# Patient Record
Sex: Female | Born: 1995 | Race: Black or African American | Hispanic: No | Marital: Single
Health system: Southern US, Community
[De-identification: ages and names within clinical notes are randomized; demographics above are authoritative.]

---
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
## 2018-10-28 DIAGNOSIS — O30039 Twin pregnancy, monochorionic/diamniotic, unspecified trimester: Secondary | ICD-10-CM | POA: Insufficient documentation

## 2018-12-27 DIAGNOSIS — O09892 Supervision of other high risk pregnancies, second trimester: Secondary | ICD-10-CM | POA: Insufficient documentation

## 2019-01-11 DIAGNOSIS — O30039 Twin pregnancy, monochorionic/diamniotic, unspecified trimester: Secondary | ICD-10-CM | POA: Insufficient documentation

## 2019-01-11 DIAGNOSIS — O365990 Maternal care for other known or suspected poor fetal growth, unspecified trimester, not applicable or unspecified: Secondary | ICD-10-CM | POA: Insufficient documentation

## 2019-03-14 DIAGNOSIS — O43023 Fetus-to-fetus placental transfusion syndrome, third trimester: Secondary | ICD-10-CM | POA: Insufficient documentation

## 2019-03-19 DIAGNOSIS — O99019 Anemia complicating pregnancy, unspecified trimester: Secondary | ICD-10-CM | POA: Insufficient documentation

## 2019-03-22 DIAGNOSIS — O43029 Fetus-to-fetus placental transfusion syndrome, unspecified trimester: Secondary | ICD-10-CM

## 2019-03-22 DIAGNOSIS — O329XX0 Maternal care for malpresentation of fetus, unspecified, not applicable or unspecified: Secondary | ICD-10-CM

## 2019-07-20 ENCOUNTER — Ambulatory Visit: Payer: Medicaid Other | Admitting: Podiatry

## 2019-08-24 ENCOUNTER — Encounter (HOSPITAL_COMMUNITY): Payer: Self-pay

## 2019-08-24 ENCOUNTER — Ambulatory Visit (HOSPITAL_COMMUNITY)
Admission: EM | Admit: 2019-08-24 | Discharge: 2019-08-24 | Disposition: A | Discharge status: Home / Self Care | Payer: Medicaid Other | Attending: Family Medicine | Admitting: Family Medicine

## 2019-08-24 ENCOUNTER — Other Ambulatory Visit: Payer: Self-pay

## 2019-08-24 ENCOUNTER — Ambulatory Visit (INDEPENDENT_AMBULATORY_CARE_PROVIDER_SITE_OTHER): Payer: Medicaid Other

## 2019-08-24 DIAGNOSIS — M533 Sacrococcygeal disorders, not elsewhere classified: Secondary | ICD-10-CM | POA: Diagnosis not present

## 2019-08-24 NOTE — ED Provider Notes (Signed)
[facility]    CSN: [id number] Arrival date & time: 08/24/19  1815      History   Chief Complaint Chief Complaint  Patient presents with  . tailbone pain    HPI Ana Paul is a 24 y.o. female.   She is presenting with tailbone pain.  This is acute on chronic in nature.  Seems to be localized to this area.  She has been dealing this for years.  She recently had a C-section and was carrying twins.  The pain seems gotten worse recently.  No radicular symptoms.  No history of surgery.  Unsure if she ever has had trauma to the area.  HPI  History reviewed. No pertinent past medical history.  There are no problems to display for this patient.   Past Surgical History:  Procedure Laterality Date  . CESAREAN SECTION      OB History   No obstetric history on file.      Home Medications    Prior to Admission medications   Not on File    Family History Family History  Problem Relation Age of Onset  . Healthy Mother   . Healthy Father     Social History Social History   Tobacco Use  . Smoking status: Never Smoker  . Smokeless tobacco: Never Used  Substance Use Topics  . Alcohol use: Never  . Drug use: Never     Allergies   Patient has no known allergies.   Review of Systems Review of Systems See HPI  Physical Exam Triage Vital Signs ED Triage Vitals [08/24/19 1832]  Enc Vitals Group     BP 118/79     Pulse Rate 81     Resp 16     Temp 98.6 F (37 C)     Temp Source Oral     SpO2 100 %     Weight 210 lb (95.3 kg)     Height 5\' 5"  (1.651 m)     Head Circumference      Peak Flow      Pain Score 8     Pain Loc      Pain Edu?      Excl. in [place]?    No data found.  Updated Vital Signs BP 118/79   Pulse 81   Temp 98.6 F (37 C) (Oral)   Resp 16   Ht 5\' 5"  (1.651 m)   Wt 95.3 kg   SpO2 100%   BMI 34.95 kg/m   Visual Acuity Right Eye Distance:   Left Eye Distance:   Bilateral Distance:    Right Eye Near:   Left Eye  Near:    Bilateral Near:     Physical Exam Gen: NAD, alert, cooperative with exam, well-appearing Neuro: normal tone, normal sensation to touch Psych:  normal insight, alert and oriented MSK:  TTP at the tip of the coccyx. Normal flexion extension of the back. Normal internal and external rotation of the hips. No tenderness to palpation of the SI joints. Neurovascularly intact   UC Treatments / Results  Labs (all labs ordered are listed, but only abnormal results are displayed) Labs Reviewed - No data to display  EKG   Radiology DG Sacrum/Coccyx  Result Date: 08/24/2019 CLINICAL DATA:  Coccyx pain. Injured tailbone 15 years ago now with worsening pain EXAM: SACRUM AND COCCYX - 2+ VIEW COMPARISON:  None. FINDINGS: There is no evidence of fracture or other focal bone lesions. IMPRESSION: Negative. Electronically Signed  By: Kerby Moors M.D.   On: 08/24/2019 19:09    Procedures Procedures (including critical care time)  Medications Ordered in UC Medications - No data to display  Initial Impression / Assessment and Plan / UC Course  I have reviewed the triage vital signs and the nursing notes.  Pertinent labs & imaging results that were available during my care of the patient were reviewed by me and considered in my medical decision making (see chart for details).     Ana Paul is a 24 year old female is presenting with symptoms suggestive coccydynia.  She recently gave birth to twins but did have a C-section.  Symptoms been ongoing.  Imaging was negative.  Counseled on supportive care given indications to return and follow-up.  Final Clinical Impressions(s) / UC Diagnoses   Final diagnoses:  Coccydynia     Discharge Instructions     Please try ibuprofen  Please try to alternate heat and ice  Please follow up if your symptoms fail to improve.     ED Prescriptions    None     PDMP not reviewed this encounter.   Rosemarie Ax, MD 08/24/19 [phone]

## 2019-08-24 NOTE — ED Triage Notes (Signed)
Pt states she has tailbone pain that has been gong on for years, but it got extremely worse upon waking today. Pt c/o 7/10 pain in tailbone. Pt states she hit her tailbone when she was 75 or 25 y/o and it had been hurting on and off since then, but lately it's gotten worse

## 2019-08-24 NOTE — Discharge Instructions (Addendum)
Please try ibuprofen  Please try to alternate heat and ice  Please follow up if your symptoms fail to improve.

## 2019-08-31 ENCOUNTER — Ambulatory Visit: Payer: Medicaid Other | Attending: Internal Medicine

## 2019-08-31 DIAGNOSIS — Z20822 Contact with and (suspected) exposure to covid-19: Secondary | ICD-10-CM

## 2019-09-01 LAB — SARS-COV-2, NAA 2 DAY TAT

## 2019-09-01 LAB — NOVEL CORONAVIRUS, NAA: SARS-CoV-2, NAA: NOT DETECTED

## 2019-11-15 ENCOUNTER — Ambulatory Visit: Payer: Medicaid Other | Admitting: Podiatry

## 2019-11-15 ENCOUNTER — Other Ambulatory Visit: Payer: Self-pay

## 2019-11-15 DIAGNOSIS — L600 Ingrowing nail: Secondary | ICD-10-CM | POA: Diagnosis not present

## 2019-11-15 DIAGNOSIS — M79675 Pain in left toe(s): Secondary | ICD-10-CM

## 2019-11-15 NOTE — Patient Instructions (Signed)

## 2019-11-16 NOTE — Progress Notes (Signed)
Subjective:   Patient ID: Ana Paul, female   DOB: 24 y.o.   MRN: [id number]   HPI 24 year old female presents the office today for concerns of a painful ingrown toenail left big toe, medial aspect which is been ongoing since February has been intermittent.  Denies any drainage or pus coming from the area.  She does note some swelling to the nail border.  Denies any red streaks.  She has no other concerns today.   Review of Systems  All other systems reviewed and are negative.  No past medical history on file.  Past Surgical History:  Procedure Laterality Date   CESAREAN SECTION      No current outpatient medications on file.  No Known Allergies       Objective:  Physical Exam  General: AAO x3, NAD  Dermatological: Incurvation present to the medial aspect of the left hallux toenail with tenderness palpation there is localized edema there is no erythema or ascending cellulitis.  No drainage or pus identified today.  There is no open lesions.  Vascular: Dorsalis Pedis artery and Posterior Tibial artery pedal pulses are 2/4 bilateral with immedate capillary fill time. There is no pain with calf compression, swelling, warmth, erythema.   Neruologic: Grossly intact via light touch bilateral.   Musculoskeletal: No gross boney pedal deformities bilateral. No pain, crepitus, or limitation noted with foot and ankle range of motion bilateral. Muscular strength 5/5 in all groups tested bilateral.  Gait: Unassisted, Nonantalgic.       Assessment:   Left medial hallux ingrown toenail    Plan:  -Treatment options discussed including all alternatives, risks, and complications -Etiology of symptoms were discussed -At this time, the patient is requesting partial nail removal with chemical matricectomy to the symptomatic portion of the nail. Risks and complications were discussed with the patient for which they understand and written consent was obtained. Under sterile conditions a  total of 3 mL of a mixture of 2% lidocaine plain and 0.5% Marcaine plain was infiltrated in a hallux block fashion. Once anesthetized, the skin was prepped in sterile fashion. A tourniquet was then applied. Next the medial aspect of hallux nail border was then sharply excised making sure to remove the entire offending nail border. Once the nails were ensured to be removed area was debrided and the underlying skin was intact. There is no purulence identified in the procedure. Next phenol was then applied under standard conditions and copiously irrigated. Silvadene was applied. A dry sterile dressing was applied. After application of the dressing the tourniquet was removed and there is found to be an immediate capillary refill time to the digit. The patient tolerated the procedure well any complications. Post procedure instructions were discussed the patient for which he verbally understood. Follow-up in one week for nail check or sooner if any problems are to arise. Discussed signs/symptoms of infection and directed to call the office immediately should any occur or go directly to the emergency room. In the meantime, encouraged to call the office with any questions, concerns, changes symptoms.  Return for nail check-left big toe .  Trula Slade DPM

## 2019-11-24 ENCOUNTER — Encounter: Payer: Self-pay | Admitting: Podiatry

## 2019-11-24 ENCOUNTER — Ambulatory Visit (INDEPENDENT_AMBULATORY_CARE_PROVIDER_SITE_OTHER): Payer: Medicaid Other | Admitting: Podiatry

## 2019-11-24 ENCOUNTER — Other Ambulatory Visit: Payer: Self-pay

## 2019-11-24 DIAGNOSIS — L600 Ingrowing nail: Secondary | ICD-10-CM

## 2019-11-24 NOTE — Progress Notes (Signed)
  Subjective:  Patient ID: Letetia Romanello, female    DOB: 01-04-1996,  MRN: [id number]  Chief Complaint  Patient presents with  . Ingrown Toenail    2WK F/U- pt states she is doign well,     24 y.o. female presents with the above complaint. History confirmed with patient. Still doing soaks and neosporin/bandaid  Objective:  Physical Exam: warm, good capillary refill, no trophic changes or ulcerative lesions, normal DP and PT pulses and normal sensory exam. Left Foot: hallux nail site healing well, mild fibrotic drainage, no SOI Assessment:   1. Ingrown toenail      Plan:  Patient was evaluated and treated and all questions answered.  OK to soak once daily for next week, leave OTA at night, keep covered with clean dry bandaid while in closed shoes / out of house.  Return in about 6 weeks (around 01/05/2020) for nail check with [facility].   Lanae Crumbly, DPM 11/24/2019

## 2020-01-05 ENCOUNTER — Ambulatory Visit: Payer: Medicaid Other | Admitting: Podiatry

## 2020-01-10 ENCOUNTER — Other Ambulatory Visit: Payer: Self-pay

## 2020-01-10 ENCOUNTER — Other Ambulatory Visit: Payer: Medicaid Other

## 2020-01-10 ENCOUNTER — Other Ambulatory Visit: Payer: Self-pay | Admitting: *Deleted

## 2020-01-10 DIAGNOSIS — Z20822 Contact with and (suspected) exposure to covid-19: Secondary | ICD-10-CM

## 2020-01-11 LAB — NOVEL CORONAVIRUS, NAA

## 2020-03-27 ENCOUNTER — Emergency Department (HOSPITAL_COMMUNITY)
Admission: EM | Admit: 2020-03-27 | Discharge: 2020-03-28 | Disposition: A | Discharge status: Home / Self Care | Payer: Medicaid Other | Attending: Emergency Medicine | Admitting: Emergency Medicine

## 2020-03-27 ENCOUNTER — Encounter (HOSPITAL_COMMUNITY): Payer: Self-pay | Admitting: Emergency Medicine

## 2020-03-27 ENCOUNTER — Other Ambulatory Visit: Payer: Self-pay

## 2020-03-27 DIAGNOSIS — D509 Iron deficiency anemia, unspecified: Secondary | ICD-10-CM | POA: Diagnosis not present

## 2020-03-27 DIAGNOSIS — K429 Umbilical hernia without obstruction or gangrene: Secondary | ICD-10-CM | POA: Diagnosis not present

## 2020-03-27 DIAGNOSIS — R1033 Periumbilical pain: Secondary | ICD-10-CM | POA: Diagnosis present

## 2020-03-27 NOTE — ED Triage Notes (Signed)
Pt st's she has a umbilical hernia and is having pain at the site

## 2020-03-28 ENCOUNTER — Other Ambulatory Visit: Payer: Self-pay

## 2020-03-28 LAB — COMPREHENSIVE METABOLIC PANEL
ALT: 19 U/L (ref 0–44)
AST: 20 U/L (ref 15–41)
Albumin: 3.8 g/dL (ref 3.5–5.0)
Alkaline Phosphatase: 67 U/L (ref 38–126)
Anion gap: 8 (ref 5–15)
BUN: 9 mg/dL (ref 6–20)
CO2: 25 mmol/L (ref 22–32)
Calcium: 9.2 mg/dL (ref 8.9–10.3)
Chloride: 105 mmol/L (ref 98–111)
Creatinine, Ser: 0.75 mg/dL (ref 0.44–1.00)
GFR, Estimated: >60 mL/min (ref >60)
Glucose, Bld: 101 mg/dL — ABNORMAL HIGH (ref 70–99)
Potassium: 3.7 mmol/L (ref 3.5–5.1)
Sodium: 138 mmol/L (ref 135–145)
Total Bilirubin: 0.8 mg/dL (ref 0.3–1.2)
Total Protein: 7 g/dL (ref 6.5–8.1)

## 2020-03-28 LAB — LACTIC ACID, PLASMA: Lactic Acid, Venous: 1.1 mmol/L (ref 0.5–1.9)

## 2020-03-28 LAB — CBC WITH DIFFERENTIAL/PLATELET
Abs Immature Granulocytes: 0.01 10*3/uL (ref 0.00–0.07)
Basophils Absolute: 0 10*3/uL (ref 0.0–0.1)
Basophils Relative: 1 %
Eosinophils Absolute: 0.1 10*3/uL (ref 0.0–0.5)
Eosinophils Relative: 2 %
HCT: 39.1 % (ref 36.0–46.0)
Hemoglobin: 11.7 g/dL — ABNORMAL LOW (ref 12.0–15.0)
Immature Granulocytes: 0 %
Lymphocytes Relative: 33 %
Lymphs Abs: 1.7 10*3/uL (ref 0.7–4.0)
MCH: 22.7 pg — ABNORMAL LOW (ref 26.0–34.0)
MCHC: 29.9 g/dL — ABNORMAL LOW (ref 30.0–36.0)
MCV: 75.8 fL — ABNORMAL LOW (ref 80.0–100.0)
Monocytes Absolute: 0.5 10*3/uL (ref 0.1–1.0)
Monocytes Relative: 10 %
Neutro Abs: 2.8 10*3/uL (ref 1.7–7.7)
Neutrophils Relative %: 54 %
Platelets: 280 10*3/uL (ref 150–400)
RBC: 5.16 MIL/uL — ABNORMAL HIGH (ref 3.87–5.11)
RDW: 14.9 % (ref 11.5–15.5)
WBC: 5.2 10*3/uL (ref 4.0–10.5)
nRBC: 0 % (ref 0.0–0.2)

## 2020-03-28 LAB — LIPASE, BLOOD: Lipase: 32 U/L (ref 11–51)

## 2020-03-28 MED ORDER — KETOROLAC TROMETHAMINE 30 MG/ML IJ SOLN
30.0000 mg | Freq: Once | INTRAMUSCULAR | Status: AC
  Administered 2020-03-28: 30 mg via INTRAVENOUS
  Filled 2020-03-28: qty 1

## 2020-03-28 NOTE — ED Provider Notes (Signed)
[facility] EMERGENCY DEPARTMENT Provider Note   CSN: [id number] Arrival date & time: 03/27/20  2253   History Chief Complaint  Patient presents with  . Abdominal Pain    Ana Paul is a 24 y.o. female.  The history is provided by the patient.  Abdominal Pain She has history of an umbilical hernia and comes in complaining of pain where the hernia is located. Pain started in the afternoon and has been steady. She rates pain at 7/10. There is no radiation of pain. There is no nausea or vomiting. She has not done anything to treat the pain. Nothing makes it better, nothing makes it worse.  History reviewed. No pertinent past medical history.  There are no problems to display for this patient.   Past Surgical History:  Procedure Laterality Date  . CESAREAN SECTION       OB History   No obstetric history on file.     Family History  Problem Relation Age of Onset  . Healthy Mother   . Healthy Father     Social History   Tobacco Use  . Smoking status: Never Smoker  . Smokeless tobacco: Never Used  Vaping Use  . Vaping Use: Never used  Substance Use Topics  . Alcohol use: Never  . Drug use: Never    Home Medications Prior to Admission medications   Not on File    Allergies    Patient has no known allergies.  Review of Systems   Review of Systems  Gastrointestinal: Positive for abdominal pain.  All other systems reviewed and are negative.   Physical Exam Updated Vital Signs BP 135/84 (BP Location: Left Arm)   Pulse 71   Temp 98.2 F (36.8 C) (Oral)   Resp 16   Ht 5\' 4"  (1.626 m)   Wt 97.5 kg   LMP 03/25/2020 (Exact Date)   SpO2 100%   BMI 36.90 kg/m   Physical Exam Vitals and nursing note reviewed.   24 year old female, resting comfortably and in no acute distress. Vital signs are normal3. Oxygen saturation is 100%, which is normal. Head is normocephalic and atraumatic. PERRLA, EOMI. Oropharynx is clear. Neck is  nontender and supple without adenopathy or JVD. Back is nontender and there is no CVA tenderness. Lungs are clear without rales, wheezes, or rhonchi. Chest is nontender. Heart has regular rate and rhythm without murmur. Abdomen is soft, flat, with a combined periumbilical and ventral hernia present. The ventral hernia is superior to the umbilicus. Hernia is easily reducible, but mildly tender. There are no masses or hepatosplenomegaly and peristalsis is normoactive. Extremities have no cyanosis or edema, full range of motion is present. Skin is warm and dry without rash. Neurologic: Mental status is normal, cranial nerves are intact, there are no motor or sensory deficits.  ED Results / Procedures / Treatments   Labs (all labs ordered are listed, but only abnormal results are displayed) Labs Reviewed  COMPREHENSIVE METABOLIC PANEL - Abnormal; Notable for the following components:      Result Value   Glucose, Bld 101 (*)    All other components within normal limits  CBC WITH DIFFERENTIAL/PLATELET - Abnormal; Notable for the following components:   RBC 5.16 (*)    Hemoglobin 11.7 (*)    MCV 75.8 (*)    MCH 22.7 (*)    MCHC 29.9 (*)    All other components within normal limits  LIPASE, BLOOD  LACTIC ACID, PLASMA   Procedures Procedures  Medications Ordered in ED Medications  ketorolac (TORADOL) 30 MG/ML injection 30 mg (has no administration in time range)    ED Course  I have reviewed the triage vital signs and the nursing notes.  Pertinent labs & imaging results that were available during my care of the patient were reviewed by me and considered in my medical decision making (see chart for details).  MDM Rules/Calculators/A&P Abdominal pain secondary to umbilical hernia. No evidence of incarceration. Old records are reviewed, no prior abdominal imaging. However, with benign abdominal exam, no indication for CT scan. Will check screening labs. Anticipate referral to general  surgery.  Labs are unremarkable.  She has very mild anemia.  There was significant pain relief with ketorolac.  Patient is advised to use over-the-counter analgesics as needed for pain and is referred to [facility] surgery for consultation regarding possible surgical repair of her hernia.  Final Clinical Impression(s) / ED Diagnoses Final diagnoses:  Umbilical hernia without obstruction and without gangrene  Microcytic anemia    Rx / DC Orders ED Discharge Orders    None       Delora Fuel, MD 81/10/31 [phone]

## 2020-03-28 NOTE — Discharge Instructions (Addendum)
He may take acetaminophen, ibuprofen, or naproxen as needed for pain.  If you need additional pain relief, you may add acetaminophen to either ibuprofen or naproxen.  Return if you her having severe pain or vomiting.

## 2020-04-24 ENCOUNTER — Ambulatory Visit: Payer: Self-pay | Admitting: General Surgery

## 2020-06-21 ENCOUNTER — Ambulatory Visit: Payer: Medicaid Other | Admitting: Family Medicine

## 2020-06-29 ENCOUNTER — Ambulatory Visit: Payer: Medicaid Other | Admitting: Family

## 2020-06-29 ENCOUNTER — Other Ambulatory Visit: Payer: Self-pay

## 2020-06-29 NOTE — Progress Notes (Deleted)
  Subjective:    Ana Paul - 25 y.o. female MRN [id number]  Date of birth: Jun 06, 1995  HPI  Ana Paul is to establish care. Patient has a PMH significant for ***.    Current issues and/or concerns: 1. BREAST PAIN: Duration :{Blank single:19197::"days","weeks","months"} Location: {Blank single:19197::"right","left","bilateral"} Onset: {Blank single:19197::"sudden","gradual"} Severity: {Blank single:19197::"mild","moderate","severe","1/10","2/10","3/10","4/10","5/10","6/10","7/10","8/10","9/10","10/10"} Quality: {Blank multiple:19196::"sharp","dull","aching","burning","cramping","ill-defined","itchy","pressure-like","pulling","shooting","sore","stabbing","tender","tearing","throbbing"} Frequency: {Blank single:19197::"constant","intermittent","occasional","rare","every few minutes","a few times a hour","a few times a day","a few times a week","a few times a month","a few times a year"} Redness: {Blank single:19197::"yes","no"} Swelling: {Blank single:19197::"yes","no"} Trauma: {Blank single:19197::"trauma","no trauma"} Breastfeeding: {Blank single:19197::"yes","no"} Associated with menstral cycle: {Blank single:19197::"yes","no"} Nipple discharge: {Blank single:19197::"yes","no"} Breast lump: {Blank single:19197::"yes","no"} Status: {Blank multiple:19196::"better","worse","stable","fluctuating"} Treatments attempted: {Blank multiple:19196::"none","primrose oil","ibuprofen","tylenol"} Previous mammogram: {Blank single:19197::"yes","no"}  ROS per HPI     Health Maintenance:  - *** Health Maintenance Due  Topic Date Due  . Hepatitis C Screening  Never done  . COVID-19 Vaccine (1) Never done  . HIV Screening  Never done  . PAP-Cervical Cytology Screening  Never done  . PAP SMEAR-Modifier  Never done  . INFLUENZA VACCINE  12/18/2019     Past Medical History: There are no problems to display for this patient.     Social History   reports that she has never smoked.  She has never used smokeless tobacco. She reports that she does not drink alcohol and does not use drugs.   Family History  family history includes Healthy in her father and mother.   Medications: reviewed and updated   Objective:   Physical Exam There were no vitals taken for this visit. Physical Exam      Assessment & Plan:       Durene Fruits, NP 06/29/2020, 7:50 AM Primary Care at [facility]

## 2020-07-17 ENCOUNTER — Other Ambulatory Visit (HOSPITAL_COMMUNITY): Payer: Medicaid Other

## 2020-07-19 ENCOUNTER — Ambulatory Visit: Admit: 2020-07-19 | Payer: Medicaid Other | Admitting: General Surgery

## 2020-07-19 SURGERY — REPAIR, HERNIA, VENTRAL, LAPAROSCOPIC
Anesthesia: General

## 2020-12-08 ENCOUNTER — Emergency Department (HOSPITAL_COMMUNITY)
Admission: EM | Admit: 2020-12-08 | Discharge: 2020-12-08 | Disposition: A | Discharge status: Home / Self Care | Payer: Medicaid Other | Attending: Emergency Medicine | Admitting: Emergency Medicine

## 2020-12-08 ENCOUNTER — Other Ambulatory Visit: Payer: Self-pay

## 2020-12-08 DIAGNOSIS — H6122 Impacted cerumen, left ear: Secondary | ICD-10-CM

## 2020-12-08 DIAGNOSIS — H9202 Otalgia, left ear: Secondary | ICD-10-CM | POA: Diagnosis present

## 2020-12-08 NOTE — ED Provider Notes (Signed)
[facility] DEPT Provider Note   CSN: [id number] Arrival date & time: 12/08/20  2002     History Chief Complaint  Patient presents with   Otalgia    Ana Paul is a 25 y.o. female.  25 year old female with complaint of left ear discomfort, feels like her ear has been clogged for a few days, now painful.  Denies recent swimming.  States that she does have mild runny nose and cough.  No other complaints or concerns.      No past medical history on file.  There are no problems to display for this patient.   OB History   No obstetric history on file.     No family history on file.     Home Medications Prior to Admission medications   Not on File    Allergies    Patient has no known allergies.  Review of Systems   Review of Systems  Constitutional:  Negative for fever.  HENT:  Positive for congestion and ear pain.   Respiratory:  Negative for cough.   Musculoskeletal:  Negative for arthralgias and myalgias.  Skin:  Negative for rash and wound.  Allergic/Immunologic: Negative for immunocompromised state.  Neurological:  Negative for weakness and headaches.  Hematological:  Negative for adenopathy.   Physical Exam Updated Vital Signs BP (!) 141/85 (BP Location: Right Arm)   Pulse 85   Temp 98.4 F (36.9 C) (Oral)   Resp 18   Ht '5\' 4"'$  (1.626 m)   Wt 100.2 kg   SpO2 100%   BMI 37.93 kg/m   Physical Exam Vitals and nursing note reviewed.  Constitutional:      General: She is not in acute distress.    Appearance: She is well-developed. She is not diaphoretic.  HENT:     Head: Normocephalic and atraumatic.     Right Ear: Tympanic membrane and ear canal normal.     Left Ear: There is impacted cerumen.     Nose: Nose normal.     Mouth/Throat:     Mouth: Mucous membranes are moist.  Eyes:     Conjunctiva/sclera: Conjunctivae normal.  Pulmonary:     Effort: Pulmonary effort is normal.  Musculoskeletal:     Cervical  back: Neck supple.  Lymphadenopathy:     Cervical: No cervical adenopathy.  Skin:    General: Skin is warm and dry.     Findings: No erythema or rash.  Neurological:     Mental Status: She is alert and oriented to person, place, and time.  Psychiatric:        Behavior: Behavior normal.    ED Results / Procedures / Treatments   Labs (all labs ordered are listed, but only abnormal results are displayed) Labs Reviewed - No data to display  EKG None  Radiology No results found.  Procedures .Ear Cerumen Removal  Date/Time: 12/08/2020 8:57 PM Performed by: Tacy Learn, PA-C Authorized by: Tacy Learn, PA-C   Consent:    Consent obtained:  Verbal   Consent given by:  Patient   Risks discussed:  Bleeding, infection, incomplete removal, pain and TM perforation   Alternatives discussed:  No treatment Universal protocol:    Patient identity confirmed:  Verbally with patient Procedure details:    Location:  L ear   Procedure type: curette     Procedure outcomes: cerumen removed   Post-procedure details:    Inspection:  Ear canal clear and TM intact   Hearing quality:  Improved   Procedure completion:  Tolerated well, no immediate complications   Medications Ordered in ED Medications - No data to display  ED Course  I have reviewed the triage vital signs and the nursing notes.  Pertinent labs & imaging results that were available during my care of the patient were reviewed by me and considered in my medical decision making (see chart for details).  Clinical Course as of 12/08/20 2057  Sat Dec 09, 3750  5939 25 year old female with left ear pain found to have impacted cerumen which was removed with curette without difficulty.  TM intact after removal. [LM]    Clinical Course User Index [LM] Roque Lias   MDM Rules/Calculators/A&P                           Final Clinical Impression(s) / ED Diagnoses Final diagnoses:  Impacted cerumen of left ear     Rx / DC Orders ED Discharge Orders     None        Roque Lias 12/08/20 2057    Dorie Rank, MD 12/10/20 1012

## 2020-12-08 NOTE — Discharge Instructions (Addendum)
Follow-up with your primary care provider if pain persists.  Referral to PCP if needed.

## 2020-12-08 NOTE — ED Triage Notes (Signed)
Pt came in with c/o L ear pain that started two hours ago. Pt states that she has a runny nose and cough as well.

## 2020-12-13 ENCOUNTER — Ambulatory Visit
Admission: RE | Admit: 2020-12-13 | Discharge: 2020-12-13 | Disposition: A | Discharge status: Home / Self Care | Payer: Medicaid Other | Source: Ambulatory Visit | Attending: Urgent Care | Admitting: Urgent Care

## 2020-12-13 ENCOUNTER — Other Ambulatory Visit: Payer: Self-pay

## 2020-12-13 VITALS — BP 118/79 | HR 66 | Temp 98.0°F | Resp 18

## 2020-12-13 DIAGNOSIS — R070 Pain in throat: Secondary | ICD-10-CM

## 2020-12-13 DIAGNOSIS — J069 Acute upper respiratory infection, unspecified: Secondary | ICD-10-CM | POA: Diagnosis present

## 2020-12-13 LAB — POCT RAPID STREP A (OFFICE): Rapid Strep A Screen: NEGATIVE

## 2020-12-13 MED ORDER — BENZONATATE 100 MG PO CAPS: 100.0000 mg | ORAL_CAPSULE | Freq: Three times a day (TID) | ORAL | 0 refills | Status: DC | PRN

## 2020-12-13 MED ORDER — PROMETHAZINE-DM 6.25-15 MG/5ML PO SYRP: 5.0000 mL | ORAL_SOLUTION | Freq: Every evening | ORAL | 0 refills | Status: DC | PRN

## 2020-12-13 MED ORDER — CETIRIZINE HCL 10 MG PO TABS: 10.0000 mg | ORAL_TABLET | Freq: Every day | ORAL | 0 refills | Status: DC

## 2020-12-13 MED ORDER — PSEUDOEPHEDRINE HCL 60 MG PO TABS: 60.0000 mg | ORAL_TABLET | Freq: Three times a day (TID) | ORAL | 0 refills | Status: DC | PRN

## 2020-12-13 NOTE — Discharge Instructions (Addendum)

## 2020-12-13 NOTE — ED Triage Notes (Signed)
5 day sore throat, decreased hearing from her right ear and onset this morning up left eye discharge. Pt confirms dysphagia and notes upon waking her eye was crusted shut. Has been taking tylenol with some relief. No abdominal pain, n/v/d/r.

## 2020-12-13 NOTE — ED Provider Notes (Signed)
[place]   MRN: [id number] DOB: [birth year]  Subjective:   Ana Paul is a 25 y.o. female presenting for 5-day history of acute onset throat pain, painful swallowing, sinus congestion and sinus pressure, right ear pain and left eye discharge.  Patient has been using Tylenol with some relief.  Has also had a little bit of a cough.  Denies chest pain, shortness of breath, wheezing.  No history of asthma.  Patient on a smoker.  She is COVID vaccinated but no booster.  No current facility-administered medications for this encounter. No current outpatient medications on file.   No Known Allergies  History reviewed. No pertinent past medical history.   Past Surgical History:  Procedure Laterality Date   CESAREAN SECTION     CESAREAN SECTION N/A    Phreesia 06/29/2020    Family History  Problem Relation Age of Onset   Healthy Mother    Healthy Father     Social History   Tobacco Use   Smoking status: Never   Smokeless tobacco: Never  Vaping Use   Vaping Use: Never used  Substance Use Topics   Alcohol use: Never   Drug use: Never    ROS   Objective:   Vitals: BP 118/79 (BP Location: Left Arm)   Pulse 66   Temp 98 F (36.7 C) (Oral)   Resp 18   SpO2 98%   Physical Exam Constitutional:      General: She is not in acute distress.    Appearance: Normal appearance. She is well-developed. She is not ill-appearing, toxic-appearing or diaphoretic.  HENT:     Head: Normocephalic and atraumatic.     Right Ear: Ear canal normal. No drainage or tenderness. No middle ear effusion. Tympanic membrane is not erythematous.     Left Ear: Ear canal normal. No drainage or tenderness.  No middle ear effusion. Tympanic membrane is not erythematous.     Ears:     Comments: TMs opacified bilaterally.    Nose: Congestion and rhinorrhea present.     Mouth/Throat:     Mouth: Mucous membranes are moist. No oral lesions.     Pharynx: Oropharynx is clear. No  pharyngeal swelling, oropharyngeal exudate, posterior oropharyngeal erythema or uvula swelling.     Tonsils: No tonsillar exudate or tonsillar abscesses.  Eyes:     Extraocular Movements: Extraocular movements intact.     Right eye: Normal extraocular motion.     Left eye: Normal extraocular motion.     Conjunctiva/sclera: Conjunctivae normal.     Pupils: Pupils are equal, round, and reactive to light.  Cardiovascular:     Rate and Rhythm: Normal rate and regular rhythm.     Pulses: Normal pulses.     Heart sounds: Normal heart sounds. No murmur heard.   No friction rub. No gallop.  Pulmonary:     Effort: Pulmonary effort is normal. No respiratory distress.     Breath sounds: Normal breath sounds. No stridor. No wheezing, rhonchi or rales.  Musculoskeletal:     Cervical back: Normal range of motion and neck supple.  Lymphadenopathy:     Cervical: No cervical adenopathy.  Skin:    General: Skin is warm and dry.     Findings: No rash.  Neurological:     General: No focal deficit present.     Mental Status: She is alert and oriented to person, place, and time.  Psychiatric:        Mood and Affect: Mood normal.  Behavior: Behavior normal.        Thought Content: Thought content normal.    Results for orders placed or performed during the hospital encounter of 12/13/20 (from the past 24 hour(s))  POCT rapid strep A     Status: None   Collection Time: 12/13/20 12:53 PM  Result Value Ref Range   Rapid Strep A Screen Negative Negative    Assessment and Plan :   PDMP not reviewed this encounter.  1. Viral URI with cough   2. Throat pain     Will manage for viral illness such as viral URI, viral syndrome, viral rhinitis, COVID-19. Counseled patient on nature of COVID-19 including modes of transmission, diagnostic testing, management and supportive care.  Offered scripts for symptomatic relief. COVID 19 and strep culture are pending. Counseled patient on potential for  adverse effects with medications prescribed/recommended today, ER and return-to-clinic precautions discussed, patient verbalized understanding.     Jaynee Eagles, PA-C 12/13/20 1258

## 2020-12-14 LAB — SARS-COV-2, NAA 2 DAY TAT

## 2020-12-14 LAB — NOVEL CORONAVIRUS, NAA: SARS-CoV-2, NAA: NOT DETECTED

## 2020-12-16 LAB — CULTURE, GROUP A STREP (THRC)

## 2021-03-24 ENCOUNTER — Other Ambulatory Visit: Payer: Self-pay

## 2021-03-24 ENCOUNTER — Emergency Department (HOSPITAL_COMMUNITY)
Admission: EM | Admit: 2021-03-24 | Discharge: 2021-03-25 | Discharge status: Against Medical Advice | Payer: BLUE CROSS/BLUE SHIELD | Attending: Emergency Medicine | Admitting: Emergency Medicine

## 2021-03-24 ENCOUNTER — Encounter (HOSPITAL_COMMUNITY): Payer: Self-pay

## 2021-03-24 DIAGNOSIS — M79671 Pain in right foot: Secondary | ICD-10-CM | POA: Insufficient documentation

## 2021-03-24 DIAGNOSIS — M79672 Pain in left foot: Secondary | ICD-10-CM | POA: Insufficient documentation

## 2021-03-24 DIAGNOSIS — Z5321 Procedure and treatment not carried out due to patient leaving prior to being seen by health care provider: Secondary | ICD-10-CM | POA: Insufficient documentation

## 2021-03-24 NOTE — ED Triage Notes (Signed)
Pt complains of bilateral foot pain when she walks. Pt states that it is a burning feeling and that she has to slide her feet across the floor.

## 2021-09-22 ENCOUNTER — Emergency Department (HOSPITAL_COMMUNITY): Payer: Medicaid Other

## 2021-09-22 ENCOUNTER — Encounter (HOSPITAL_COMMUNITY): Payer: Self-pay | Admitting: Emergency Medicine

## 2021-09-22 ENCOUNTER — Other Ambulatory Visit: Payer: Self-pay

## 2021-09-22 ENCOUNTER — Emergency Department (HOSPITAL_COMMUNITY)
Admission: EM | Admit: 2021-09-22 | Discharge: 2021-09-22 | Disposition: A | Discharge status: Home / Self Care | Payer: Medicaid Other | Attending: Emergency Medicine | Admitting: Emergency Medicine

## 2021-09-22 DIAGNOSIS — Y9341 Activity, dancing: Secondary | ICD-10-CM | POA: Diagnosis not present

## 2021-09-22 DIAGNOSIS — S8991XA Unspecified injury of right lower leg, initial encounter: Secondary | ICD-10-CM | POA: Diagnosis present

## 2021-09-22 DIAGNOSIS — W1830XA Fall on same level, unspecified, initial encounter: Secondary | ICD-10-CM | POA: Diagnosis not present

## 2021-09-22 DIAGNOSIS — S83004A Unspecified dislocation of right patella, initial encounter: Secondary | ICD-10-CM | POA: Insufficient documentation

## 2021-09-22 MED ORDER — PROPOFOL 10 MG/ML IV BOLUS
INTRAVENOUS | Status: AC | PRN
  Administered 2021-09-22: 50.1 mg via INTRAVENOUS

## 2021-09-22 MED ORDER — HYDROMORPHONE HCL 1 MG/ML IJ SOLN
1.0000 mg | Freq: Once | INTRAMUSCULAR | Status: AC
  Administered 2021-09-22: 1 mg via INTRAVENOUS
  Filled 2021-09-22: qty 1

## 2021-09-22 MED ORDER — ONDANSETRON HCL 4 MG/2ML IJ SOLN
4.0000 mg | Freq: Once | INTRAMUSCULAR | Status: AC
  Administered 2021-09-22: 4 mg via INTRAVENOUS
  Filled 2021-09-22: qty 2

## 2021-09-22 MED ORDER — MIDAZOLAM HCL (PF) 5 MG/ML IJ SOLN
2.0000 mg | Freq: Once | INTRAMUSCULAR | Status: AC
  Administered 2021-09-22: 2 mg via INTRAVENOUS
  Filled 2021-09-22: qty 1

## 2021-09-22 MED ORDER — PROPOFOL 10 MG/ML IV BOLUS
0.5000 mg/kg | Freq: Once | INTRAVENOUS | Status: AC
  Administered 2021-09-22: 50.1 mg via INTRAVENOUS
  Filled 2021-09-22: qty 20

## 2021-09-22 NOTE — Progress Notes (Signed)
Orthopedic Tech Progress Note ?Patient Details:  ?Ana Paul ?[birth year] ?[id number] ? ?Ortho Devices ?Type of Ortho Device: Knee Immobilizer, Crutches ?Ortho Device/Splint Location: RLE ?Ortho Device/Splint Interventions: Ordered, Application, Adjustment ?  ?Post Interventions ?Patient Tolerated: Well ?Instructions Provided: Adjustment of device, Care of device ? ?Vernona Rieger ?09/22/2021, 6:52 PM ? ?

## 2021-09-22 NOTE — Discharge Instructions (Signed)
Wear the knee immobilizer and use the crutches.  Use Tylenol or Motrin as needed for pain.  Follow-up with the orthopedic doctor.  Return to the ED with new or worsening symptoms. ?

## 2021-09-22 NOTE — ED Provider Notes (Signed)
?[place] ?Provider Note ? ? ?CSN: [id number] ?Arrival date & time: 09/22/21  1655 ? ?  ? ?History ? ?Chief Complaint  ?Patient presents with  ? Fall  ? ? ?Ana Paul is a 26 y.o. female. ? ?Patient presents with right leg pain that onset today while she was dancing.  States she did not fall but was leaning backwards and felt something pull into her right leg and knee area.  EMS reports pain and deformity to right knee.  Unable to bear weight or bend the knee.  Did not fall or hit her head.  No head, neck, back, chest or abdominal pain.  No fever. ?Denies any other injury ? ?The history is provided by the patient and the EMS personnel.  ?Fall ?Pertinent negatives include no chest pain, no abdominal pain, no headaches and no shortness of breath.  ? ?  ? ?Home Medications ?Prior to Admission medications   ?Medication Sig Start Date End Date Taking? Authorizing Provider  ?benzonatate (TESSALON) 100 MG capsule Take 1-2 capsules (100-200 mg total) by mouth 3 (three) times daily as needed. 12/13/20   Jaynee Eagles, PA-C  ?cetirizine (ZYRTEC ALLERGY) 10 MG tablet Take 1 tablet (10 mg total) by mouth daily. 12/13/20   Jaynee Eagles, PA-C  ?promethazine-dextromethorphan (PROMETHAZINE-DM) 6.25-15 MG/5ML syrup Take 5 mLs by mouth at bedtime as needed for cough. 12/13/20   Jaynee Eagles, PA-C  ?pseudoephedrine (SUDAFED) 60 MG tablet Take 1 tablet (60 mg total) by mouth every 8 (eight) hours as needed for congestion. 12/13/20   Jaynee Eagles, PA-C  ?   ? ?Allergies    ?Patient has no known allergies.   ? ?Review of Systems   ?Review of Systems  ?Constitutional:  Negative for activity change, appetite change and fever.  ?HENT:  Negative for congestion and rhinorrhea.   ?Respiratory:  Negative for cough, chest tightness and shortness of breath.   ?Cardiovascular:  Negative for chest pain.  ?Gastrointestinal:  Negative for abdominal pain, nausea and vomiting.  ?Genitourinary:  Negative for dysuria and  hematuria.  ?Musculoskeletal:  Positive for arthralgias and myalgias.  ?Skin:  Negative for rash.  ?Neurological:  Negative for dizziness, weakness and headaches.  ? all other systems are negative except as noted in the HPI and PMH.  ? ?Physical Exam ?Updated Vital Signs ?BP 124/75 (BP Location: Right Arm)   Pulse 78   Temp 97.6 ?F (36.4 ?C) (Temporal)   Resp 20   Ht '5\' 4"'$  (1.626 m)   Wt 100.2 kg   SpO2 100%   BMI 37.92 kg/m?  ?Physical Exam ?Vitals and nursing note reviewed.  ?Constitutional:   ?   General: She is not in acute distress. ?   Appearance: She is well-developed.  ?HENT:  ?   Head: Normocephalic and atraumatic.  ?   Mouth/Throat:  ?   Pharynx: No oropharyngeal exudate.  ?Eyes:  ?   Conjunctiva/sclera: Conjunctivae normal.  ?   Pupils: Pupils are equal, round, and reactive to light.  ?Neck:  ?   Comments: No meningismus. ?Cardiovascular:  ?   Rate and Rhythm: Normal rate and regular rhythm.  ?   Heart sounds: Normal heart sounds. No murmur heard. ?Pulmonary:  ?   Effort: Pulmonary effort is normal. No respiratory distress.  ?   Breath sounds: Normal breath sounds.  ?Abdominal:  ?   Palpations: Abdomen is soft.  ?   Tenderness: There is no abdominal tenderness. There is no guarding or rebound.  ?  Musculoskeletal:     ?   General: Swelling, tenderness and deformity present.  ?   Cervical back: Normal range of motion and neck supple.  ?   Comments: Lateral deformity to patella.  Unable to flex right knee.  Intact DP and PT pulses.  Compartments are soft  ?Skin: ?   General: Skin is warm.  ?   Capillary Refill: Capillary refill takes less than 2 seconds.  ?Neurological:  ?   General: No focal deficit present.  ?   Mental Status: She is alert and oriented to person, place, and time. Mental status is at baseline.  ?   Cranial Nerves: No cranial nerve deficit.  ?   Motor: No abnormal muscle tone.  ?   Coordination: Coordination normal.  ?   Comments:  5/5 strength throughout. CN 2-12 intact.Equal grip  strength.   ?Psychiatric:     ?   Behavior: Behavior normal.  ? ? ?ED Results / Procedures / Treatments   ?Labs ?(all labs ordered are listed, but only abnormal results are displayed) ?Labs Reviewed - No data to display ? ?EKG ?None ? ?Radiology ?DG Knee Right Port ? ?Result Date: 09/22/2021 ?CLINICAL DATA:  Status post fall. EXAM: PORTABLE RIGHT KNEE - 1-2 VIEW COMPARISON:  None Available. FINDINGS: No evidence of an acute fracture or dislocation. No evidence of arthropathy or other focal bone abnormality. A small to moderate sized joint effusion is noted. IMPRESSION: 1. Interval reduction of the lateral patellar dislocation seen on the prior study. 2. Small to moderate sized joint effusion. Electronically Signed   By: Virgina Norfolk M.D.   On: 09/22/2021 18:46  ? ?DG Knee Right Port ? ?Result Date: 09/22/2021 ?CLINICAL DATA:  Status post fall. EXAM: PORTABLE RIGHT KNEE - 1-2 VIEW COMPARISON:  None Available. FINDINGS: No evidence of an acute fracture. Lateral dislocation of the right patella is seen. No evidence of arthropathy or other focal bone abnormality. A small joint effusion is noted. IMPRESSION: Lateral dislocation of the right patella with a small joint effusion. Electronically Signed   By: Virgina Norfolk M.D.   On: 09/22/2021 17:38   ? ?Procedures ?.Sedation ? ?Date/Time: 09/22/2021 6:24 PM ?Performed by: Ezequiel Essex, MD ?Authorized by: Ezequiel Essex, MD  ? ?Consent:  ?  Consent obtained:  Written ?  Consent given by:  Patient ?  Risks discussed:  Allergic reaction, prolonged hypoxia resulting in organ damage, prolonged sedation necessitating reversal, respiratory compromise necessitating ventilatory assistance and intubation, vomiting, nausea, inadequate sedation and dysrhythmia ?  Alternatives discussed:  Analgesia without sedation ?Universal protocol:  ?  Procedure explained and questions answered to patient or proxy's satisfaction: yes   ?  Relevant documents present and verified: yes   ?   Test results available: yes   ?  Imaging studies available: yes   ?  Required blood products, implants, devices, and special equipment available: yes   ?  Site/side marked: yes   ?  Immediately prior to procedure, a time out was called: yes   ?  Patient identity confirmed:  Provided demographic data ?Indications:  ?  Procedure performed:  Dislocation reduction ?  Procedure necessitating sedation performed by:  Physician performing sedation ?Pre-sedation assessment:  ?  Time since last food or drink:  6 ?  ASA classification: class 1 - normal, healthy patient   ?  Mouth opening:  3 or more finger widths ?  Thyromental distance:  4 finger widths ?  Mallampati score:  I - soft  palate, uvula, fauces, pillars visible ?  Neck mobility: normal   ?  Pre-sedation assessments completed and reviewed: airway patency, cardiovascular function, hydration status, mental status, nausea/vomiting, pain level, respiratory function and temperature   ?  Pre-sedation assessment completed:  09/22/2021 5:55 PM ?Immediate pre-procedure details:  ?  Reassessment: Patient reassessed immediately prior to procedure   ?  Reviewed: vital signs   ?  Verified: bag valve mask available, emergency equipment available, intubation equipment available, IV patency confirmed, oxygen available, reversal medications available and suction available   ?Procedure details (see MAR for exact dosages):  ?  Preoxygenation:  Nasal cannula ?  Sedation:  Propofol ?  Analgesia:  Hydromorphone ?  Intra-procedure monitoring:  Blood pressure monitoring, continuous capnometry, frequent LOC assessments, frequent vital sign checks, continuous pulse oximetry and cardiac monitor ?  Intra-procedure events: none   ?  Total Provider sedation time (minutes):  10 ?Post-procedure details:  ?  Post-sedation assessment completed:  09/22/2021 6:25 PM ?  Attendance: Constant attendance by certified staff until patient recovered   ?  Recovery: Patient returned to pre-procedure baseline   ?   Post-sedation assessments completed and reviewed: airway patency, cardiovascular function, hydration status, mental status, nausea/vomiting, pain level, respiratory function and temperature   ?  Patient is stable

## 2021-09-22 NOTE — ED Notes (Signed)
Patient verbalizes understanding of discharge instructions. Opportunity for questioning and answers were provided. Armband removed by staff, pt discharged from ED to home with significant other ?

## 2021-09-22 NOTE — ED Notes (Signed)
Tolerating PO fluids °

## 2021-09-22 NOTE — ED Triage Notes (Signed)
Pt BIB EMS for a fall while dancing. Obvious deformity to right knee.  ?

## 2021-09-22 NOTE — ED Notes (Signed)
Boyfriend at bedside, states that he will be pts ride home.  ?

## 2021-09-24 ENCOUNTER — Ambulatory Visit (INDEPENDENT_AMBULATORY_CARE_PROVIDER_SITE_OTHER): Payer: Medicaid Other | Admitting: Orthopaedic Surgery

## 2021-09-24 ENCOUNTER — Encounter: Payer: Self-pay | Admitting: Orthopaedic Surgery

## 2021-09-24 DIAGNOSIS — S83004A Unspecified dislocation of right patella, initial encounter: Secondary | ICD-10-CM

## 2021-09-24 MED ORDER — TRAMADOL HCL 50 MG PO TABS: 50.0000 mg | ORAL_TABLET | Freq: Three times a day (TID) | ORAL | 2 refills | Status: DC | PRN

## 2021-09-24 NOTE — Progress Notes (Signed)
? ?  Office Visit Note ?  ?Patient: Ana Paul           ?Date of Birth: Nov 09, 1995           ?MRN: [id number] ?Visit Date: 09/24/2021 ?             ?Requested by: No referring provider defined for this encounter. ?PCP: Pcp, No ? ? ?Assessment & Plan: ?Visit Diagnoses:  ?1. Closed dislocation of right patella, initial encounter   ? ? ?Plan: Impression is right knee patella dislocation.  At this point, the patient is exhibiting moderate tenderness along the medial retinaculum as well as patella apprehension.  I am concerned for MPFL injury.  I would like to go ahead and order an MRI to assess for this.  Follow-up once completed.  In the meantime, she will continue wearing her knee immobilizer.  She will continue to ice and elevate for pain and swelling.  I have sent in tramadol to take as needed.  Call with concerns or questions. ? ?Follow-Up Instructions: Return for after MRI.  ? ?Orders:  ?No orders of the defined types were placed in this encounter. ? ?Meds ordered this encounter  ?Medications  ? traMADol (ULTRAM) 50 MG tablet  ?  Sig: Take 1 tablet (50 mg total) by mouth 3 (three) times daily as needed.  ?  Dispense:  30 tablet  ?  Refill:  2  ? ? ? ? Procedures: ?No procedures performed ? ? ?Clinical Data: ?No additional findings. ? ? ?Subjective: ?Chief Complaint  ?Patient presents with  ? Right Knee - Pain  ? ? ?HPI patient is a pleasant 26 year old female who comes in today following an injury to her right knee.  She was at church this past Sunday, 09/22/2021 when she was dancing and dislocated her patella.  She was seen in the ED where x-rays were obtained.  X-rays demonstrated a dislocated patella.  This was reduced.  She was placed in a knee immobilizer.  She is here today for follow-up.  She is having pain to the entire knee worse with bearing weight.  No previous dislocation or subluxation event. ? ?Review of Systems as detailed in HPI.  All others reviewed and are negative. ? ? ?Objective: ?Vital Signs:  There were no vitals taken for this visit. ? ?Physical Exam well-developed well-nourished female no acute distress.  Alert and oriented x3. ? ?Ortho Exam right shoulder exam shows a moderate effusion.  She has apprehension and guarding with range of motion and attempted straight leg raise.  She has moderate tenderness to the medial retinaculum.  She does exhibit patellar apprehension.  She is neurovascularly intact distally. ? ?Specialty Comments:  ?No specialty comments available. ? ?Imaging: ?No new imaging ? ? ?PMFS History: ?There are no problems to display for this patient. ? ?History reviewed. No pertinent past medical history.  ?Family History  ?Problem Relation Age of Onset  ? Healthy Mother   ? Healthy Father   ?  ?Past Surgical History:  ?Procedure Laterality Date  ? CESAREAN SECTION    ? CESAREAN SECTION N/A   ? Phreesia 06/29/2020  ? ?Social History  ? ?Occupational History  ? Not on file  ?Tobacco Use  ? Smoking status: Never  ? Smokeless tobacco: Never  ?Vaping Use  ? Vaping Use: Never used  ?Substance and Sexual Activity  ? Alcohol use: Never  ? Drug use: Never  ? Sexual activity: Not on file  ? ? ? ? ? ? ?

## 2021-09-27 ENCOUNTER — Other Ambulatory Visit: Payer: Self-pay

## 2021-09-27 DIAGNOSIS — S83004A Unspecified dislocation of right patella, initial encounter: Secondary | ICD-10-CM

## 2021-10-12 ENCOUNTER — Ambulatory Visit
Admission: RE | Admit: 2021-10-12 | Discharge: 2021-10-12 | Disposition: A | Discharge status: Home / Self Care | Payer: Medicaid Other | Source: Ambulatory Visit | Attending: Orthopaedic Surgery | Admitting: Orthopaedic Surgery

## 2021-10-12 DIAGNOSIS — S83004A Unspecified dislocation of right patella, initial encounter: Secondary | ICD-10-CM

## 2021-10-15 ENCOUNTER — Ambulatory Visit (INDEPENDENT_AMBULATORY_CARE_PROVIDER_SITE_OTHER): Payer: Medicaid Other | Admitting: Orthopaedic Surgery

## 2021-10-15 ENCOUNTER — Encounter: Payer: Self-pay | Admitting: Orthopaedic Surgery

## 2021-10-15 DIAGNOSIS — S83281A Other tear of lateral meniscus, current injury, right knee, initial encounter: Secondary | ICD-10-CM | POA: Diagnosis not present

## 2021-10-15 DIAGNOSIS — S76111A Strain of right quadriceps muscle, fascia and tendon, initial encounter: Secondary | ICD-10-CM | POA: Diagnosis not present

## 2021-10-15 DIAGNOSIS — S83004A Unspecified dislocation of right patella, initial encounter: Secondary | ICD-10-CM | POA: Diagnosis not present

## 2021-10-15 NOTE — Progress Notes (Signed)
Office Visit Note   Patient: Ana Paul           Date of Birth: 11-04-1995           MRN: [id number] Visit Date: 10/15/2021              Requested by: No referring provider defined for this encounter. PCP: Pcp, No   Assessment & Plan: Visit Diagnoses:  1. Closed dislocation of right patella, initial encounter   2. Traumatic avulsion of patellofemoral ligament, right, initial encounter   3. Acute lateral meniscus tear of right knee, initial encounter     Plan: MRI shows an acute lateral meniscus tear with parameniscal cyst as well as disruption of the MPFL and bone contusions consistent with patellar dislocation.  These findings were reviewed with the patient detail.  Based on these findings I have recommended lateral meniscus debridement and reconstruction of the MPFL.  For now would like her to regain better range of motion before intervening surgically.  We will place her in a PSO brace and have her work on gentle range of motion.  Recheck in 3 weeks.  Follow-Up Instructions: Return in about 3 weeks (around 11/05/2021).   Orders:  No orders of the defined types were placed in this encounter.  No orders of the defined types were placed in this encounter.     Procedures: No procedures performed   Clinical Data: No additional findings.   Subjective: Chief Complaint  Patient presents with   Right Knee - Pain    HPI Patient returns today to discuss right knee MRI scan.  She feels a little bit better overall but it is still swollen.  Review of Systems  Constitutional: Negative.   HENT: Negative.    Eyes: Negative.   Respiratory: Negative.    Cardiovascular: Negative.   Endocrine: Negative.   Musculoskeletal: Negative.   Neurological: Negative.   Hematological: Negative.   Psychiatric/Behavioral: Negative.    All other systems reviewed and are negative.   Objective: Vital Signs: There were no vitals taken for this visit.  Physical Exam Vitals and  nursing note reviewed.  Constitutional:      Appearance: She is well-developed.  Pulmonary:     Effort: Pulmonary effort is normal.  Skin:    General: Skin is warm.     Capillary Refill: Capillary refill takes less than 2 seconds.  Neurological:     Mental Status: She is alert and oriented to person, place, and time.  Psychiatric:        Behavior: Behavior normal.        Thought Content: Thought content normal.        Judgment: Judgment normal.    Ortho Exam Examination right knee shows patella apprehension and tenderness along the medial patellar border.  Moderate joint effusion.  Laterally patella tracking with knee flexion.  Lateral joint line tenderness.  Specialty Comments:  No specialty comments available.  Imaging: No results found.   PMFS History: Patient Active Problem List   Diagnosis Date Noted   Closed dislocation of right patella 10/15/2021   Traumatic avulsion of patellofemoral ligament, right, initial encounter 10/15/2021   Acute lateral meniscus tear of right knee 10/15/2021   History reviewed. No pertinent past medical history.  Family History  Problem Relation Age of Onset   Healthy Mother    Healthy Father     Past Surgical History:  Procedure Laterality Date   CESAREAN SECTION     CESAREAN SECTION N/A  Phreesia 06/29/2020   Social History   Occupational History   Not on file  Tobacco Use   Smoking status: Never   Smokeless tobacco: Never  Vaping Use   Vaping Use: Never used  Substance and Sexual Activity   Alcohol use: Never   Drug use: Never   Sexual activity: Not on file

## 2021-11-05 ENCOUNTER — Ambulatory Visit: Payer: Medicaid Other | Admitting: Orthopaedic Surgery

## 2021-11-12 ENCOUNTER — Emergency Department (HOSPITAL_COMMUNITY)
Admission: EM | Admit: 2021-11-12 | Discharge: 2021-11-12 | Disposition: A | Discharge status: Home / Self Care | Payer: Medicaid Other | Attending: Emergency Medicine | Admitting: Emergency Medicine

## 2021-11-12 ENCOUNTER — Encounter: Payer: Self-pay | Admitting: Orthopaedic Surgery

## 2021-11-12 ENCOUNTER — Other Ambulatory Visit: Payer: Self-pay

## 2021-11-12 ENCOUNTER — Ambulatory Visit (INDEPENDENT_AMBULATORY_CARE_PROVIDER_SITE_OTHER): Payer: Medicaid Other | Admitting: Orthopaedic Surgery

## 2021-11-12 ENCOUNTER — Encounter (HOSPITAL_COMMUNITY): Payer: Self-pay | Admitting: Emergency Medicine

## 2021-11-12 DIAGNOSIS — S83281A Other tear of lateral meniscus, current injury, right knee, initial encounter: Secondary | ICD-10-CM

## 2021-11-12 DIAGNOSIS — S83004A Unspecified dislocation of right patella, initial encounter: Secondary | ICD-10-CM

## 2021-11-12 DIAGNOSIS — B349 Viral infection, unspecified: Secondary | ICD-10-CM | POA: Diagnosis not present

## 2021-11-12 DIAGNOSIS — S76111A Strain of right quadriceps muscle, fascia and tendon, initial encounter: Secondary | ICD-10-CM | POA: Diagnosis not present

## 2021-11-12 DIAGNOSIS — Z20822 Contact with and (suspected) exposure to covid-19: Secondary | ICD-10-CM | POA: Diagnosis not present

## 2021-11-12 DIAGNOSIS — R509 Fever, unspecified: Secondary | ICD-10-CM | POA: Diagnosis present

## 2021-11-12 LAB — URINALYSIS, ROUTINE W REFLEX MICROSCOPIC
Bilirubin Urine: NEGATIVE
Glucose, UA: NEGATIVE mg/dL
Ketones, ur: NEGATIVE mg/dL
Nitrite: NEGATIVE
Protein, ur: NEGATIVE mg/dL
Specific Gravity, Urine: 1.019 (ref 1.005–1.030)
pH: 7 (ref 5.0–8.0)

## 2021-11-12 LAB — PREGNANCY, URINE: Preg Test, Ur: NEGATIVE

## 2021-11-12 LAB — RESP PANEL BY RT-PCR (FLU A&B, COVID) ARPGX2
Influenza A by PCR: NEGATIVE
Influenza B by PCR: NEGATIVE
SARS Coronavirus 2 by RT PCR: NEGATIVE

## 2021-11-12 MED ORDER — METOCLOPRAMIDE HCL 10 MG PO TABS
10.0000 mg | ORAL_TABLET | Freq: Once | ORAL | Status: AC
  Administered 2021-11-12: 10 mg via ORAL
  Filled 2021-11-12: qty 1

## 2021-11-12 MED ORDER — DIPHENHYDRAMINE HCL 25 MG PO TABS: 25.0000 mg | ORAL_TABLET | Freq: Four times a day (QID) | ORAL | 0 refills | Status: DC | PRN

## 2021-11-12 MED ORDER — IBUPROFEN 200 MG PO TABS
600.0000 mg | ORAL_TABLET | Freq: Once | ORAL | Status: AC
  Administered 2021-11-12: 600 mg via ORAL
  Filled 2021-11-12: qty 3

## 2021-11-12 MED ORDER — METOCLOPRAMIDE HCL 10 MG PO TABS: 10.0000 mg | ORAL_TABLET | Freq: Four times a day (QID) | ORAL | 0 refills | Status: DC

## 2021-11-12 MED ORDER — DIPHENHYDRAMINE HCL 25 MG PO CAPS
25.0000 mg | ORAL_CAPSULE | Freq: Once | ORAL | Status: AC
  Administered 2021-11-12: 25 mg via ORAL
  Filled 2021-11-12: qty 1

## 2021-11-12 NOTE — ED Provider Notes (Signed)
[facility] DEPT Provider Note   CSN: [id number] Arrival date & time: 11/12/21  2023     History  Chief Complaint  Patient presents with   Fever   Generalized Body Aches    Ana Paul is a 26 y.o. female.   Fever Patient with past medical history significant for C-section.  She is presented today for fever, generalized myalgias/aches and cramping abd pain since this AM.  States chills and body aches throughout the day and developed headache and neck pain.   No rhinorrhea, mild cough.  No urinary sx.  No abd pain currently.   Diarrhea and vomiting x1 NBNB     Home Medications Prior to Admission medications   Medication Sig Start Date End Date Taking? Authorizing Provider  diphenhydrAMINE (BENADRYL) 25 MG tablet Take 1 tablet (25 mg total) by mouth every 6 (six) hours as needed. 11/12/21  Yes Warda Mcqueary S, PA  metoCLOPramide (REGLAN) 10 MG tablet Take 1 tablet (10 mg total) by mouth every 6 (six) hours. 11/12/21  Yes Clessie Karras S, PA  benzonatate (TESSALON) 100 MG capsule Take 1-2 capsules (100-200 mg total) by mouth 3 (three) times daily as needed. 12/13/20   Jaynee Eagles, PA-C  cetirizine (ZYRTEC ALLERGY) 10 MG tablet Take 1 tablet (10 mg total) by mouth daily. 12/13/20   Jaynee Eagles, PA-C  promethazine-dextromethorphan (PROMETHAZINE-DM) 6.25-15 MG/5ML syrup Take 5 mLs by mouth at bedtime as needed for cough. 12/13/20   Jaynee Eagles, PA-C  pseudoephedrine (SUDAFED) 60 MG tablet Take 1 tablet (60 mg total) by mouth every 8 (eight) hours as needed for congestion. 12/13/20   Jaynee Eagles, PA-C  traMADol (ULTRAM) 50 MG tablet Take 1 tablet (50 mg total) by mouth 3 (three) times daily as needed. 09/24/21   Aundra Dubin, PA-C      Allergies    Patient has no known allergies.    Review of Systems   Review of Systems  Constitutional:  Positive for fever.    Physical Exam Updated Vital Signs BP (!) 109/58 (BP Location: Left Arm)   Pulse  87   Temp 98.7 F (37.1 C) (Oral)   Resp 18   Ht '5\' 4"'$  (1.626 m)   Wt 92.1 kg   LMP 10/21/2021 (Approximate)   SpO2 98%   BMI 34.84 kg/m  Physical Exam Vitals and nursing note reviewed.  Constitutional:      General: She is not in acute distress. HENT:     Head: Normocephalic and atraumatic.     Nose: Nose normal.  Eyes:     General: No scleral icterus. Cardiovascular:     Rate and Rhythm: Normal rate and regular rhythm.     Pulses: Normal pulses.     Heart sounds: Normal heart sounds.  Pulmonary:     Effort: Pulmonary effort is normal. No respiratory distress.     Breath sounds: No wheezing.  Abdominal:     Palpations: Abdomen is soft.     Tenderness: There is no abdominal tenderness.  Musculoskeletal:     Cervical back: Normal range of motion.     Right lower leg: No edema.     Left lower leg: No edema.  Skin:    General: Skin is warm and dry.     Capillary Refill: Capillary refill takes less than 2 seconds.  Neurological:     Mental Status: She is alert. Mental status is at baseline.  Psychiatric:        Mood and  Affect: Mood normal.        Behavior: Behavior normal.     ED Results / Procedures / Treatments   Labs (all labs ordered are listed, but only abnormal results are displayed) Labs Reviewed  URINALYSIS, ROUTINE W REFLEX MICROSCOPIC - Abnormal; Notable for the following components:      Result Value   APPearance HAZY (*)    Hgb urine dipstick SMALL (*)    Leukocytes,Ua SMALL (*)    Bacteria, UA FEW (*)    All other components within normal limits  RESP PANEL BY RT-PCR (FLU A&B, COVID) [id number]  PREGNANCY, URINE    EKG None  Radiology No results found.  Procedures Procedures    Medications Ordered in ED Medications  metoCLOPramide (REGLAN) tablet 10 mg (10 mg Oral Given 11/12/21 2141)  diphenhydrAMINE (BENADRYL) capsule 25 mg (25 mg Oral Given 11/12/21 2142)  ibuprofen (ADVIL) tablet 600 mg (600 mg Oral Given 11/12/21 2140)    ED Course/  Medical Decision Making/ A&P Clinical Course as of 11/12/21 2339  Tue Nov 12, 2021  2127 Fever, aches and cramping abd pain since this AM.  States chills and body aches throughout the day and developed headache and neck pain.   No rhinorrhea, mild cough.  No urinary sx.  No abd pain currently.   Diarrhea and vomiting x1 NBNB [WF]  2131 Pregnancy, urine Pt states no possibility of pregnancy. Will check but go ahead with ibuprofen.  [WF]    Clinical Course User Index [WF] Tedd Sias, [place]                           Medical Decision Making Amount and/or Complexity of Data Reviewed Labs: ordered. Decision-making details documented in ED Course.  Risk OTC drugs. Prescription drug management.   Patient with past medical history significant for C-section.  She is presented today for fever, generalized myalgias/aches and cramping abd pain since this AM.  States chills and body aches throughout the day and developed headache and neck pain.   No rhinorrhea, mild cough.  No urinary sx.  No abd pain currently.   Diarrhea and vomiting x1 NBNB   Physical exam unremarkable abdomen soft nontender.  Patient defervesced with antipyretics here.  Her headache and nausea completely resolved with 1 dose of Reglan  She is requesting discharge home, she is tolerating p.o. her vital signs have normalized I repeated her blood pressure which is 110/60 I recommend that she hydrate at home.  COVID influenza negative and urinalysis without compelling evidence of infection and she has no urinary symptoms.  Pregnancy test negative.  She understands she will need to follow-up closely with her primary care provider she will need to return to the emergency room for any new or concerning symptoms.   Final Clinical Impression(s) / ED Diagnoses Final diagnoses:  Viral illness    Rx / DC Orders ED Discharge Orders          Ordered    metoCLOPramide (REGLAN) 10 MG tablet  Every 6 hours         11/12/21 2241    diphenhydrAMINE (BENADRYL) 25 MG tablet  Every 6 hours PRN        11/12/21 2241              Tedd Sias, [place] 11/12/21 2341    Charlesetta Shanks, MD 11/15/21 1231

## 2021-11-12 NOTE — ED Notes (Signed)
I provided reinforced discharge education based off of discharge instructions. Pt acknowledged and understood my education. Pt had no further questions/concerns for provider/myself.  °

## 2021-11-12 NOTE — ED Notes (Signed)
Pt ambulatory without assistance.  

## 2021-11-27 NOTE — Therapy (Signed)
OUTPATIENT PHYSICAL THERAPY LOWER EXTREMITY EVALUATION   Patient Name: Ana Paul MRN: [id number] DOB:[birth year], 26 y.o., female Today's Date: 11/28/2021   PT End of Session - 11/28/21 1121     Visit Number 1    Number of Visits 17    Date for PT Re-Evaluation 01/30/22    Authorization Type [employer] MCD    Authorization Time Period Pending auth    PT Start Time 1049    PT Stop Time 1129    PT Time Calculation (min) 40 min    Activity Tolerance Patient tolerated treatment well    Behavior During Therapy [facility] for tasks assessed/performed             History reviewed. No pertinent past medical history. Past Surgical History:  Procedure Laterality Date   CESAREAN SECTION     CESAREAN SECTION N/A    [employer] 06/29/2020   Patient Active Problem List   Diagnosis Date Noted   Closed dislocation of right patella 10/15/2021   Traumatic avulsion of patellofemoral ligament, right, initial encounter 10/15/2021   Acute lateral meniscus tear of right knee 10/15/2021    PCP: No PCP  REFERRING PROVIDER: Leandrew Koyanagi, MD  REFERRING DIAG:  S83.004A (ICD-10-CM) - Closed dislocation of right patella, initial encounter  S83.281A (ICD-10-CM) - Acute lateral meniscus tear of right knee, initial encounter  S76.111A (ICD-10-CM) - Traumatic avulsion of patellofemoral ligament, right, initial encounter    THERAPY DIAG:  Chronic pain of right knee  Difficulty in walking, not elsewhere classified  Muscle weakness (generalized)  Rationale for Evaluation and Treatment Rehabilitation  ONSET DATE: 09/22/2021  SUBJECTIVE:   SUBJECTIVE STATEMENT: Pt reports primary c/o Rt knee pain s/p an injury on 5/7 in which she was leaning backwards and to the Rt while dancing at her church when her Rt patella dislocated laterally, causing her to fall down. MRI results are detailed below. Pt has a lateral meniscus tear, bone bruising, and suprapatellar effusion on the Rt. She reports since her  injury, her pain has improved, although she still has intermittent achy pain. Current pain is 2/10. Worst pain is 8/10. Best pain is 0/10. Aggravating factors include prolonged sitting >1 hour, walking up stairs, laying on Rt side, prolonged standing >15 minutes. Easing factors include massage, knee movement. Pt denies any N/T related to this problem.  PERTINENT HISTORY: Lateral Rt patellar dislocation on 5/7 resulting in lateral meniscus tear, bone bruising, and suprapatellar effusion (see MRI for details)  PAIN:  Are you having pain? Yes: NPRS scale: 2/10 Pain location: Rt knee Pain description: Achy Aggravating factors: prolonged sitting >1 hour, walking up stairs, laying on Rt side, prolonged standing >15 minutes Relieving factors: massage, knee movement.   PRECAUTIONS: None  WEIGHT BEARING RESTRICTIONS No  FALLS:  Has patient fallen in last 6 months? Yes. Number of falls 1, related to injury  LIVING ENVIRONMENT: Lives with: lives with their family Lives in: House/apartment Stairs: Yes: Internal: 15 steps; on right going up and External: 2 steps; none Has following equipment at home: Crutches  OCCUPATION: Unemployed  PLOF: [place] for church, running for exercise   OBJECTIVE:   DIAGNOSTIC FINDINGS: 10/12/2021: MR Knee Right Without Contrast: IMPRESSION: 1. Bone contusions of the medial patella and lateral aspect of the lateral femoral condyle with disruption of the medial patellofemoral ligament consistent with transient patellar dislocation.   2.  Large suprapatellar joint effusion.   3. Horizontal tear of the anterior horn of the lateral meniscus with  small parameniscal cyst measuring up to 5 mm, likely sequela of recent trauma.   4. Cruciate and collateral ligaments are intact. Quadriceps and patellar tendon are also maintained.  PATIENT SURVEYS:  LEFS 45/80  COGNITION:  Overall cognitive status: Within functional limits for tasks  assessed     SENSATION: Not tested  EDEMA:  Circumferential: Rt: 43cm, Lt: 41cm  MUSCLE LENGTH: Hamstrings: WNL BIL Thomas test: WNL  POSTURE: No Significant postural limitations  PALPATION: TTP at inferior patellar pole, medial patellofemoral ligament on Rt  PASSIVE ACCESSORIES: Painful and hypomobile superior patellar mobilization  LOWER EXTREMITY ROM:  A/PROM Right eval Left eval  Knee flexion 117p!/ 120p! 135/140  Knee extension -4/ 0 -2/2   (Blank rows = not tested)  LOWER EXTREMITY MMT:  MMT Right eval Left eval  Hip flexion 5/5 5/5  Hip extension 3+/5 3+/5  Hip abduction 4+/5 4+/5  Knee flexion 5/5 5/5  Knee extension 5/5 5/5  Ankle dorsiflexion 5/5 5/5  Ankle plantarflexion 5/5 5/5   (Blank rows = not tested)   FUNCTIONAL TESTS:  5xSTS: 16 seconds Squat: 75%, minor pain SLS: WNL BIL, Trendelenburg stance on Rt  GAIT: Distance walked: 44f Assistive device utilized: None Level of assistance: Complete Independence Comments: Mild Rt antalgic gait    TODAY'S TREATMENT: 11/28/2021: Demonstrated and issued HEP   PATIENT EDUCATION:  Education details: Pt educated on prognosis, POC, LEFS, and HEP Person educated: Patient Education method: Explanation, Demonstration, and Handouts Education comprehension: verbalized understanding and returned demonstration   HOME EXERCISE PROGRAM: Access Code: [email or web address] URL: [email or web address] Date: 11/28/2021 Prepared by: TVanessa  Exercises - Seated knee extension isometric into wall  - 1 x daily - 7 x weekly - 3 sets - 5 reps - 30 seconds hold - Forward Step Down Touch with Heel  - 1 x daily - 7 x weekly - 3 sets - 10 reps - Supine Bridge  - 1 x daily - 7 x weekly - 3 sets - 10 reps - 5-sec hold  ASSESSMENT:  CLINICAL IMPRESSION: Patient is a 26y.o. F who was seen today for physical therapy evaluation and treatment for subacute Rt knee pain s/p lateral patella dislocation.   Upon assessment, her primary impairments include limited Rt knee flexion and extension AROM, weak BIL hip abductors and extensors, poor 5xSTS, painful squatting, TTP at inferior patellar pole and medial patellofemoral ligament on Rt, hypomobile and painful Rt superior patellar mobilization, and increased edema about the Rt knee. She will benefit from skilled PT to address her primary impairments and return to her prior level of function with less limitation.   OBJECTIVE IMPAIRMENTS Abnormal gait, decreased activity tolerance, decreased balance, decreased endurance, decreased mobility, difficulty walking, decreased ROM, decreased strength, hypomobility, increased edema, impaired flexibility, improper body mechanics, postural dysfunction, and pain.   ACTIVITY LIMITATIONS bending, sitting, standing, squatting, sleeping, stairs, transfers, locomotion level, and caring for others  PARTICIPATION LIMITATIONS: cleaning, laundry, driving, shopping, community activity, yard work, and church  PERSONAL FACTORS  N/A  are also affecting patient's functional outcome.   REHAB POTENTIAL: Good  CLINICAL DECISION MAKING: Stable/uncomplicated  EVALUATION COMPLEXITY: Low   GOALS: Goals reviewed with patient? Yes  SHORT TERM GOALS: Target date: 12/26/2021   Pt will report understanding and adherence to initial HEP in order to promote independence in the management of primary impairments. Baseline: HEP provided at eval Goal status: INITIAL    LONG TERM GOALS: Target date: 01/23/2022   Pt will achieve an LEFS score of  65/80 or greater in order to demonstrate improved functional ability as it relates to her knee impairments. Baseline: 45/80 Goal status: INITIAL  2.  Pt will achieve knee AROM of 0-130 in order to accomplish WNL gait pattern. Baseline: -4 - 117 Goal status: INITIAL  3.  Pt will achieve global BIL LE strength of 4+/5 or greater in order to progress her independent LE strengthening program  with less limitation. Baseline: See MMT chart Goal status: INITIAL  4.  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 Baseline: Unable to stand longer than 15 minutes without >5/10 pain Goal status: INITIAL    PLAN: PT FREQUENCY: 1-2x/week  PT DURATION: 8 weeks  PLANNED INTERVENTIONS: Therapeutic exercises, Therapeutic activity, Neuromuscular re-education, Balance training, Gait training, Patient/Family education, Joint mobilization, Stair training, Orthotic/Fit training, Aquatic Therapy, Dry Needling, Electrical stimulation, Cryotherapy, Moist heat, Taping, Vasopneumatic device, Biofeedback, Ionotophoresis '4mg'$ /ml Dexamethasone, Manual therapy, and Re-evaluation  PLAN FOR NEXT SESSION: Progress early knee ROM and strengthening as tolerated  Check all possible CPT codes: 97164 - PT Re-evaluation, 97110- Therapeutic Exercise, [phone]- Neuro Re-education, [phone] - Gait Training, 97140 - Manual Therapy, 97530 - Therapeutic Activities, 97535 - Self Care, [id number] - Mechanical traction, 97014 - Electrical stimulation (unattended), [id number] - Electrical stimulation (Manual), [id number] - Iontophoresis, [id number] - Ultrasound, [id number] - Vaso, and [id number] - Aquatic therapy     If treatment provided at initial evaluation, no treatment charged due to lack of authorization.       Vanessa [place], PT, DPT 11/28/21 11:51 AM

## 2021-11-28 ENCOUNTER — Ambulatory Visit: Payer: Medicaid Other | Attending: Orthopaedic Surgery

## 2021-11-28 ENCOUNTER — Other Ambulatory Visit: Payer: Self-pay

## 2021-11-28 DIAGNOSIS — M25561 Pain in right knee: Secondary | ICD-10-CM | POA: Diagnosis not present

## 2021-11-28 DIAGNOSIS — S83004A Unspecified dislocation of right patella, initial encounter: Secondary | ICD-10-CM | POA: Insufficient documentation

## 2021-11-28 DIAGNOSIS — R600 Localized edema: Secondary | ICD-10-CM | POA: Diagnosis present

## 2021-11-28 DIAGNOSIS — M6281 Muscle weakness (generalized): Secondary | ICD-10-CM | POA: Insufficient documentation

## 2021-11-28 DIAGNOSIS — G8929 Other chronic pain: Secondary | ICD-10-CM | POA: Insufficient documentation

## 2021-11-28 DIAGNOSIS — S76111A Strain of right quadriceps muscle, fascia and tendon, initial encounter: Secondary | ICD-10-CM | POA: Diagnosis not present

## 2021-11-28 DIAGNOSIS — R262 Difficulty in walking, not elsewhere classified: Secondary | ICD-10-CM | POA: Insufficient documentation

## 2021-11-28 DIAGNOSIS — S83281A Other tear of lateral meniscus, current injury, right knee, initial encounter: Secondary | ICD-10-CM | POA: Insufficient documentation

## 2021-12-18 ENCOUNTER — Ambulatory Visit: Payer: Medicaid Other | Attending: Orthopaedic Surgery

## 2021-12-18 DIAGNOSIS — R600 Localized edema: Secondary | ICD-10-CM | POA: Insufficient documentation

## 2021-12-18 DIAGNOSIS — G8929 Other chronic pain: Secondary | ICD-10-CM | POA: Diagnosis present

## 2021-12-18 DIAGNOSIS — R262 Difficulty in walking, not elsewhere classified: Secondary | ICD-10-CM | POA: Diagnosis present

## 2021-12-18 DIAGNOSIS — M6281 Muscle weakness (generalized): Secondary | ICD-10-CM | POA: Diagnosis present

## 2021-12-18 DIAGNOSIS — M25561 Pain in right knee: Secondary | ICD-10-CM | POA: Diagnosis present

## 2021-12-18 NOTE — Therapy (Signed)
OUTPATIENT PHYSICAL THERAPY TREATMENT NOTE   Patient Name: Ana Paul MRN: [id number] DOB:Jan 19, 1996, 26 y.o., female Today's Date: 12/18/2021  PCP: No PCP REFERRING PROVIDER: Leandrew Koyanagi, MD  END OF SESSION:   PT End of Session - 12/18/21 1448     Visit Number 2    Number of Visits 17    Date for PT Re-Evaluation 01/30/22    Authorization Type [employer] MCD    PT Start Time 1779    PT Stop Time 1527    PT Time Calculation (min) 40 min             History reviewed. No pertinent past medical history. Past Surgical History:  Procedure Laterality Date   CESAREAN SECTION     CESAREAN SECTION N/A    [employer] 06/29/2020   Patient Active Problem List   Diagnosis Date Noted   Closed dislocation of right patella 10/15/2021   Traumatic avulsion of patellofemoral ligament, right, initial encounter 10/15/2021   Acute lateral meniscus tear of right knee 10/15/2021    REFERRING DIAG:  S83.004A (ICD-10-CM) - Closed dislocation of right patella, initial encounter  S83.281A (ICD-10-CM) - Acute lateral meniscus tear of right knee, initial encounter  S76.111A (ICD-10-CM) - Traumatic avulsion of patellofemoral ligament, right, initial encounter    THERAPY DIAG:  Chronic pain of right knee  Difficulty in walking, not elsewhere classified  Muscle weakness (generalized)  Localized edema  Rationale for Evaluation and Treatment Rehabilitation  PERTINENT HISTORY: Lateral Rt patellar dislocation on 5/7 resulting in lateral meniscus tear, bone bruising, and suprapatellar effusion (see MRI for details)  PRECAUTIONS: None  SUBJECTIVE: Patient reports that she just got back from [place] where she did a lot of walking, she had swelling but not any pain.  PAIN:  Are you having pain? Yes: NPRS scale: 0/10 Pain location: Rt knee Pain description: Achy Aggravating factors: prolonged sitting >1 hour, walking up stairs, laying on Rt side, prolonged standing >15 minutes Relieving  factors: massage, knee movement.    OBJECTIVE: (objective measures completed at initial evaluation unless otherwise dated)   DIAGNOSTIC FINDINGS: 10/12/2021: MR Knee Right Without Contrast: IMPRESSION: 1. Bone contusions of the medial patella and lateral aspect of the lateral femoral condyle with disruption of the medial patellofemoral ligament consistent with transient patellar dislocation.   2.  Large suprapatellar joint effusion.   3. Horizontal tear of the anterior horn of the lateral meniscus with small parameniscal cyst measuring up to 5 mm, likely sequela of recent trauma.   4. Cruciate and collateral ligaments are intact. Quadriceps and patellar tendon are also maintained.   PATIENT SURVEYS:  LEFS 45/80   COGNITION:           Overall cognitive status: Within functional limits for tasks assessed                          SENSATION: Not tested   EDEMA:  Circumferential: Rt: 43cm, Lt: 41cm   MUSCLE LENGTH: Hamstrings: WNL BIL Thomas test: WNL   POSTURE: No Significant postural limitations   PALPATION: TTP at inferior patellar pole, medial patellofemoral ligament on Rt   PASSIVE ACCESSORIES: Painful and hypomobile superior patellar mobilization   LOWER EXTREMITY ROM:   A/PROM Right eval Left eval  Knee flexion 117p!/ 120p! 135/140  Knee extension -4/ 0 -2/2   (Blank rows = not tested)   LOWER EXTREMITY MMT:   MMT Right eval Left eval  Hip flexion 5/5 5/5  Hip  extension 3+/5 3+/5  Hip abduction 4+/5 4+/5  Knee flexion 5/5 5/5  Knee extension 5/5 5/5  Ankle dorsiflexion 5/5 5/5  Ankle plantarflexion 5/5 5/5   (Blank rows = not tested)     FUNCTIONAL TESTS:  5xSTS: 16 seconds Squat: 75%, minor pain SLS: WNL BIL, Trendelenburg stance on Rt   GAIT: Distance walked: 62f Assistive device utilized: None Level of assistance: Complete Independence Comments: Mild Rt antalgic gait       TODAY'S TREATMENT: [facility] Adult PT Treatment:                                                 DATE: 12/18/2021 Therapeutic Exercise: Heel taps from 2" step Rt 2x10 Side stepping RTB at ankle at counter x4 laps Bridges 2x10 Prone quad stretch with strap Rt x1' LAQ 4# Rt 3x10 STS with 5# KB x10 Deadlift with 5# KB from 14" box (cues for form) Manual Therapy: Medial patellar glide with tape   11/28/2021: Demonstrated and issued HEP     PATIENT EDUCATION:  Education details: Pt educated on prognosis, POC, LEFS, and HEP Person educated: Patient Education method: Explanation, Demonstration, and Handouts Education comprehension: verbalized understanding and returned demonstration     HOME EXERCISE PROGRAM: Access Code: [email or web address] URL: [email or web address] Date: 11/28/2021 Prepared by: TVanessa [place]  Exercises - Seated knee extension isometric into wall  - 1 x daily - 7 x weekly - 3 sets - 5 reps - 30 seconds hold - Forward Step Down Touch with Heel  - 1 x daily - 7 x weekly - 3 sets - 10 reps - Supine Bridge  - 1 x daily - 7 x weekly - 3 sets - 10 reps - 5-sec hold   ASSESSMENT:   CLINICAL IMPRESSION: Patient presents to PT with no current pain in the knee and reports that she recently went to [place] and moved and did not have any knee pain, but some occasional swelling that she utilized elevation for. She has notable fatigue and shaking in Rt quads with activity. She also has crepitus in BIL knees with weight bearing knee flexion motions. Applied tape with a medial glide to Rt knee for stability. Patient was able to tolerate all prescribed exercises with no adverse effects. Patient continues to benefit from skilled PT services and should be progressed as able to improve functional independence.     OBJECTIVE IMPAIRMENTS Abnormal gait, decreased activity tolerance, decreased balance, decreased endurance, decreased mobility, difficulty walking, decreased ROM, decreased strength, hypomobility, increased edema, impaired  flexibility, improper body mechanics, postural dysfunction, and pain.    ACTIVITY LIMITATIONS bending, sitting, standing, squatting, sleeping, stairs, transfers, locomotion level, and caring for others   PARTICIPATION LIMITATIONS: cleaning, laundry, driving, shopping, community activity, yard work, and church   PERSONAL FACTORS  N/A  are also affecting patient's functional outcome.    REHAB POTENTIAL: Good   CLINICAL DECISION MAKING: Stable/uncomplicated   EVALUATION COMPLEXITY: Low     GOALS: Goals reviewed with patient? Yes   SHORT TERM GOALS: Target date: 12/26/2021    Pt will report understanding and adherence to initial HEP in order to promote independence in the management of primary impairments. Baseline: HEP provided at eval Goal status: Ongoing Reports inconsistency 12/18/21       LONG TERM GOALS: Target date: 01/23/2022    Pt will achieve an  LEFS score of 65/80 or greater in order to demonstrate improved functional ability as it relates to her knee impairments. Baseline: 45/80 Goal status: INITIAL   2.  Pt will achieve knee AROM of 0-130 in order to accomplish WNL gait pattern. Baseline: -4 - 117 Goal status: INITIAL   3.  Pt will achieve global BIL LE strength of 4+/5 or greater in order to progress her independent LE strengthening program with less limitation. Baseline: See MMT chart Goal status: INITIAL   4.  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 Baseline: Unable to stand longer than 15 minutes without >5/10 pain Goal status: INITIAL       PLAN: PT FREQUENCY: 1-2x/week   PT DURATION: 8 weeks   PLANNED INTERVENTIONS: Therapeutic exercises, Therapeutic activity, Neuromuscular re-education, Balance training, Gait training, Patient/Family education, Joint mobilization, Stair training, Orthotic/Fit training, Aquatic Therapy, Dry Needling, Electrical stimulation, Cryotherapy,  Moist heat, Taping, Vasopneumatic device, Biofeedback, Ionotophoresis '4mg'$ /ml Dexamethasone, Manual therapy, and Re-evaluation   PLAN FOR NEXT SESSION: Progress early knee ROM and strengthening as tolerated    Evelene Croon, PTA 12/18/2021, 3:29 PM

## 2021-12-25 ENCOUNTER — Ambulatory Visit: Payer: Medicaid Other

## 2021-12-25 DIAGNOSIS — M25561 Pain in right knee: Secondary | ICD-10-CM | POA: Diagnosis not present

## 2021-12-25 DIAGNOSIS — R600 Localized edema: Secondary | ICD-10-CM

## 2021-12-25 DIAGNOSIS — R262 Difficulty in walking, not elsewhere classified: Secondary | ICD-10-CM

## 2021-12-25 DIAGNOSIS — M6281 Muscle weakness (generalized): Secondary | ICD-10-CM

## 2021-12-25 DIAGNOSIS — G8929 Other chronic pain: Secondary | ICD-10-CM

## 2021-12-25 NOTE — Therapy (Signed)
OUTPATIENT PHYSICAL THERAPY TREATMENT NOTE   Patient Name: Ana Paul MRN: [id number] DOB:20-Aug-1995, 26 y.o., female Today's Date: 12/25/2021  PCP: No PCP REFERRING PROVIDER: Leandrew Koyanagi, MD  END OF SESSION:   PT End of Session - 12/25/21 1449     Visit Number 3    Number of Visits 17    Date for PT Re-Evaluation 01/30/22    Authorization Type [employer] M[employer]    Authorization Time Period No auth required for first 12 visits    PT Start Time 1449    PT Stop Time 1529    PT Time Calculation (min) 40 min    Activity Tolerance Patient tolerated treatment well    Behavior During Therapy [facility] for tasks assessed/performed              History reviewed. No pertinent past medical history. Past Surgical History:  Procedure Laterality Date   CESAREAN SECTION     CESAREAN SECTION N/A    Phreesia 06/29/2020   Patient Active Problem List   Diagnosis Date Noted   Closed dislocation of right patella 10/15/2021   Traumatic avulsion of patellofemoral ligament, right, initial encounter 10/15/2021   Acute lateral meniscus tear of right knee 10/15/2021    REFERRING DIAG:  S83.004A (ICD-10-CM) - Closed dislocation of right patella, initial encounter  S83.281A (ICD-10-CM) - Acute lateral meniscus tear of right knee, initial encounter  S76.111A (ICD-10-CM) - Traumatic avulsion of patellofemoral ligament, right, initial encounter    THERAPY DIAG:  Chronic pain of right knee  Difficulty in walking, not elsewhere classified  Muscle weakness (generalized)  Localized edema  Rationale for Evaluation and Treatment Rehabilitation  PERTINENT HISTORY: Lateral Rt patellar dislocation on 5/7 resulting in lateral meniscus tear, bone bruising, and suprapatellar effusion (see MRI for details)  PRECAUTIONS: None  SUBJECTIVE: Patient presents to PT with continued knee pain, says it was sore this morning when she woke up.  PAIN:  Are you having pain? Yes: NPRS scale: 4/10 Pain  location: Rt knee Pain description: Achy Aggravating factors: prolonged sitting >1 hour, walking up stairs, laying on Rt side, prolonged standing >15 minutes Relieving factors: massage, knee movement.    OBJECTIVE: (objective measures completed at initial evaluation unless otherwise dated)   DIAGNOSTIC FINDINGS: 10/12/2021: MR Knee Right Without Contrast: IMPRESSION: 1. Bone contusions of the medial patella and lateral aspect of the lateral femoral condyle with disruption of the medial patellofemoral ligament consistent with transient patellar dislocation.   2.  Large suprapatellar joint effusion.   3. Horizontal tear of the anterior horn of the lateral meniscus with small parameniscal cyst measuring up to 5 mm, likely sequela of recent trauma.   4. Cruciate and collateral ligaments are intact. Quadriceps and patellar tendon are also maintained.   PATIENT SURVEYS:  LEFS 45/80   COGNITION:           Overall cognitive status: Within functional limits for tasks assessed                          SENSATION: Not tested   EDEMA:  Circumferential: Rt: 43cm, Lt: 41cm   MUSCLE LENGTH: Hamstrings: WNL BIL Thomas test: WNL   POSTURE: No Significant postural limitations   PALPATION: TTP at inferior patellar pole, medial patellofemoral ligament on Rt   PASSIVE ACCESSORIES: Painful and hypomobile superior patellar mobilization   LOWER EXTREMITY ROM:   A/PROM Right eval Left eval  Knee flexion 117p!/ 120p! 135/140  Knee extension -4/  0 -2/2   (Blank rows = not tested)   LOWER EXTREMITY MMT:   MMT Right eval Left eval  Hip flexion 5/5 5/5  Hip extension 3+/5 3+/5  Hip abduction 4+/5 4+/5  Knee flexion 5/5 5/5  Knee extension 5/5 5/5  Ankle dorsiflexion 5/5 5/5  Ankle plantarflexion 5/5 5/5   (Blank rows = not tested)     FUNCTIONAL TESTS:  5xSTS: 16 seconds Squat: 75%, minor pain SLS: WNL BIL, Trendelenburg stance on Rt   GAIT: Distance walked:  30f Assistive device utilized: None Level of assistance: Complete Independence Comments: Mild Rt antalgic gait       TODAY'S TREATMENT: [facility] Adult PT Treatment:                                                DATE: 12/25/2021 Therapeutic Exercise: Nustep level 6 x5 mins while gathering subjective Slant board gastroc stretch x2' Heel taps from 2" step Rt 2x10 Side stepping RTB at ankles at counter x4 laps Hip extension RTB at ankles 2x10 BIL Bridges 2x10 Bridges with hip adduction ball squeeze 2x10 Prone quad stretch with strap Rt x1' LAQ 4# Rt 3x10 Deadlift with 10# KB from 12" stacked boxes (cues for form) 2x10  [facility] Adult PT Treatment:                                                DATE: 12/18/2021 Therapeutic Exercise: Heel taps from 2" step Rt 2x10 Side stepping RTB at ankle at counter x4 laps Bridges 2x10 Prone quad stretch with strap Rt x1' LAQ 4# Rt 3x10 STS with 5# KB x10 Deadlift with 5# KB from 14" box (cues for form) Manual Therapy: Medial patellar glide with tape   11/28/2021: Demonstrated and issued HEP     PATIENT EDUCATION:  Education details: Pt educated on prognosis, POC, LEFS, and HEP Person educated: Patient Education method: Explanation, Demonstration, and Handouts Education comprehension: verbalized understanding and returned demonstration     HOME EXERCISE PROGRAM: Access Code: [email or web address] URL: [email or web address] Date: 11/28/2021 Prepared by: TVanessa Hugo  Exercises - Seated knee extension isometric into wall  - 1 x daily - 7 x weekly - 3 sets - 5 reps - 30 seconds hold - Forward Step Down Touch with Heel  - 1 x daily - 7 x weekly - 3 sets - 10 reps - Supine Bridge  - 1 x daily - 7 x weekly - 3 sets - 10 reps - 5-sec hold   ASSESSMENT:   CLINICAL IMPRESSION: Patient presents to PT with mild Rt knee pain and reports HEP compliance. Session today continued to focus on Rt knee strengthening, particularly for the quads.  Increased weight and decreased height with deadlifting this session with no increase in pain. She has crepitus in BIL knees particularly with knee extension. Patient was able to tolerate all prescribed exercises with no adverse effects. Patient continues to benefit from skilled PT services and should be progressed as able to improve functional independence.     OBJECTIVE IMPAIRMENTS Abnormal gait, decreased activity tolerance, decreased balance, decreased endurance, decreased mobility, difficulty walking, decreased ROM, decreased strength, hypomobility, increased edema, impaired flexibility, improper body mechanics, postural dysfunction, and pain.    ACTIVITY  LIMITATIONS bending, sitting, standing, squatting, sleeping, stairs, transfers, locomotion level, and caring for others   PARTICIPATION LIMITATIONS: cleaning, laundry, driving, shopping, community activity, yard work, and church   PERSONAL FACTORS  N/A  are also affecting patient's functional outcome.    REHAB POTENTIAL: Good   CLINICAL DECISION MAKING: Stable/uncomplicated   EVALUATION COMPLEXITY: Low     GOALS: Goals reviewed with patient? Yes   SHORT TERM GOALS: Target date: 12/26/2021    Pt will report understanding and adherence to initial HEP in order to promote independence in the management of primary impairments. Baseline: HEP provided at eval Goal status: Ongoing Reports inconsistency 12/18/21       LONG TERM GOALS: Target date: 01/23/2022    Pt will achieve an LEFS score of 65/80 or greater in order to demonstrate improved functional ability as it relates to her knee impairments. Baseline: 45/80 Goal status: INITIAL   2.  Pt will achieve knee AROM of 0-130 in order to accomplish WNL gait pattern. Baseline: -4 - 117 Goal status: INITIAL   3.  Pt will achieve global BIL LE strength of 4+/5 or greater in order to progress her independent LE strengthening program with less limitation. Baseline: See MMT chart Goal  status: INITIAL   4.  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 Baseline: Unable to stand longer than 15 minutes without >5/10 pain Goal status: INITIAL       PLAN: PT FREQUENCY: 1-2x/week   PT DURATION: 8 weeks   PLANNED INTERVENTIONS: Therapeutic exercises, Therapeutic activity, Neuromuscular re-education, Balance training, Gait training, Patient/Family education, Joint mobilization, Stair training, Orthotic/Fit training, Aquatic Therapy, Dry Needling, Electrical stimulation, Cryotherapy, Moist heat, Taping, Vasopneumatic device, Biofeedback, Ionotophoresis '4mg'$ /ml Dexamethasone, Manual therapy, and Re-evaluation   PLAN FOR NEXT SESSION: Progress early knee ROM and strengthening as tolerated    Margarette Canada, PTA 12/25/2021, 3:33 PM

## 2022-01-01 ENCOUNTER — Ambulatory Visit: Payer: Medicaid Other

## 2022-01-01 DIAGNOSIS — G8929 Other chronic pain: Secondary | ICD-10-CM

## 2022-01-01 DIAGNOSIS — M6281 Muscle weakness (generalized): Secondary | ICD-10-CM

## 2022-01-01 DIAGNOSIS — R600 Localized edema: Secondary | ICD-10-CM

## 2022-01-01 DIAGNOSIS — M25561 Pain in right knee: Secondary | ICD-10-CM | POA: Diagnosis not present

## 2022-01-01 DIAGNOSIS — R262 Difficulty in walking, not elsewhere classified: Secondary | ICD-10-CM

## 2022-01-01 NOTE — Therapy (Addendum)
OUTPATIENT PHYSICAL THERAPY TREATMENT NOTE/ DISCHARGE SUMMARY   Patient Name: Ana Paul MRN: [id number] DOB:[birth year], 26 y.o., female Today's Date: 01/01/2022  PCP: No PCP REFERRING PROVIDER: Leandrew Koyanagi, MD  END OF SESSION:   PT End of Session - 01/01/22 1453     Visit Number 4    Number of Visits 17    Date for PT Re-Evaluation 01/30/22    Authorization Type [employer]    Authorization Time Period No auth required for first 12 visits    PT Start Time 1452    PT Stop Time 1530    PT Time Calculation (min) 38 min               History reviewed. No pertinent past medical history. Past Surgical History:  Procedure Laterality Date   CESAREAN SECTION     CESAREAN SECTION N/A    [employer]reesia 06/29/2020   Patient Active Problem List   Diagnosis Date Noted   Closed dislocation of right patella 10/15/2021   Traumatic avulsion of patellofemoral ligament, right, initial encounter 10/15/2021   Acute lateral meniscus tear of right knee 10/15/2021    REFERRING DIAG:  S83.004A (ICD-10-CM) - Closed dislocation of right patella, initial encounter  S83.281A (ICD-10-CM) - Acute lateral meniscus tear of right knee, initial encounter  S76.111A (ICD-10-CM) - Traumatic avulsion of patellofemoral ligament, right, initial encounter    THERAPY DIAG:  Chronic pain of right knee  Difficulty in walking, not elsewhere classified  Muscle weakness (generalized)  Localized edema  Rationale for Evaluation and Treatment Rehabilitation  PERTINENT HISTORY: Lateral Rt patellar dislocation on 5/7 resulting in lateral meniscus tear, bone bruising, and suprapatellar effusion (see MRI for details)  PRECAUTIONS: None  SUBJECTIVE: Patient presents to PT with continued knee pain, reports she began school this week and 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  PAIN:  Are you having pain? Yes: NPRS scale: 6/10 Pain location: Rt knee Pain description: Achy Aggravating  factors: prolonged sitting >1 hour, walking up stairs, laying on Rt side, prolonged standing >15 minutes Relieving factors: massage, knee movement.    OBJECTIVE: (objective measures completed at initial evaluation unless otherwise dated)   DIAGNOSTIC FINDINGS: 10/12/2021: MR Knee Right Without Contrast: IMPRESSION: 1. Bone contusions of the medial patella and lateral aspect of the lateral femoral condyle with disruption of the medial patellofemoral ligament consistent with transient patellar dislocation.   2.  Large suprapatellar joint effusion.   3. Horizontal tear of the anterior horn of the lateral meniscus with small parameniscal cyst measuring up to 5 mm, likely sequela of recent trauma.   4. Cruciate and collateral ligaments are intact. Quadriceps and patellar tendon are also maintained.   PATIENT SURVEYS:  LEFS 45/80   COGNITION:           Overall cognitive status: Within functional limits for tasks assessed                          SENSATION: Not tested   EDEMA:  Circumferential: Rt: 43cm, Lt: 41cm   MUSCLE LENGTH: Hamstrings: WNL BIL Thomas test: WNL   POSTURE: No Significant postural limitations   PALPATION: TTP at inferior patellar pole, medial patellofemoral ligament on Rt   PASSIVE ACCESSORIES: Painful and hypomobile superior patellar mobilization   LOWER EXTREMITY ROM:   A/PROM Right eval Left eval  Knee flexion 117p!/ 120p! 135/140  Knee extension -4/ 0 -2/2   (Blank rows = not tested)  LOWER EXTREMITY MMT:   MMT Right eval Left eval  Hip flexion 5/5 5/5  Hip extension 3+/5 3+/5  Hip abduction 4+/5 4+/5  Knee flexion 5/5 5/5  Knee extension 5/5 5/5  Ankle dorsiflexion 5/5 5/5  Ankle plantarflexion 5/5 5/5   (Blank rows = not tested)     FUNCTIONAL TESTS:  5xSTS: 16 seconds Squat: 75%, minor pain SLS: WNL BIL, Trendelenburg stance on Rt   GAIT: Distance walked: 17ft Assistive device utilized: None Level of assistance: Complete  Independence Comments: Mild Rt antalgic gait       TODAY'S TREATMENT: [facility] Adult PT Treatment:                                                DATE: 01/01/2022 Therapeutic Exercise: Bike level 3 x 5 mins Slant board gastroc stretch x2' Heel taps from 2" step Rt 2x10 Side stepping RTB at ankles at counter x4 laps Hip extension RTB at ankles 2x10 BIL Bridges 2x10 Bridges with hip adduction ball squeeze 2x10 Prone quad stretch with strap Rt x1' Omega knee extension 5# 2x10 BIL Deadlift with 10# KB from 10" stacked boxes (cues for form) 2x10  [facility] Adult PT Treatment:                                                DATE: 12/25/2021 Therapeutic Exercise: Nustep level 6 x5 mins while gathering subjective Slant board gastroc stretch x2' Heel taps from 2" step Rt 2x10 Side stepping RTB at ankles at counter x4 laps Hip extension RTB at ankles 2x10 BIL Bridges 2x10 Bridges with hip adduction ball squeeze 2x10 Prone quad stretch with strap Rt x1' LAQ 4# Rt 3x10 Deadlift with 10# KB from 12" stacked boxes (cues for form) 2x10  [facility] Adult PT Treatment:                                                DATE: 12/18/2021 Therapeutic Exercise: Heel taps from 2" step Rt 2x10 Side stepping RTB at ankle at counter x4 laps Bridges 2x10 Prone quad stretch with strap Rt x1' LAQ 4# Rt 3x10 STS with 5# KB x10 Deadlift with 5# KB from 14" box (cues for form) Manual Therapy: Medial patellar glide with tape      PATIENT EDUCATION:  Education details: Pt educated on prognosis, POC, LEFS, and HEP Person educated: Patient Education method: Consulting civil engineer, Demonstration, and Handouts Education comprehension: verbalized understanding and returned demonstration     HOME EXERCISE PROGRAM: Access Code: [email or web address] URL: [email or web address] Date: 11/28/2021 Prepared by: Vanessa [place]   Exercises - Seated knee extension isometric into wall  - 1 x daily - 7 x weekly - 3 sets - 5 reps - 30  seconds hold - Forward Step Down Touch with Heel  - 1 x daily - 7 x weekly - 3 sets - 10 reps - Supine Bridge  - 1 x daily - 7 x weekly - 3 sets - 10 reps - 5-sec hold   ASSESSMENT:   CLINICAL IMPRESSION: Patient presents to PT with moderate Rt knee pain and reports HEP  compliance. Session today continued to focus on Rt knee strengthening, particular focus on the quads. She remains having crepitus in BIL knees with extension activities. Patient was able to tolerate all prescribed exercises with no adverse effects. Patient continues to benefit from skilled PT services and should be progressed as able to improve functional independence.     OBJECTIVE IMPAIRMENTS Abnormal gait, decreased activity tolerance, decreased balance, decreased endurance, decreased mobility, difficulty walking, decreased ROM, decreased strength, hypomobility, increased edema, impaired flexibility, improper body mechanics, postural dysfunction, and pain.    ACTIVITY LIMITATIONS bending, sitting, standing, squatting, sleeping, stairs, transfers, locomotion level, and caring for others   PARTICIPATION LIMITATIONS: cleaning, laundry, driving, shopping, community activity, yard work, and church   PERSONAL FACTORS  N/A  are also affecting patient's functional outcome.    REHAB POTENTIAL: Good   CLINICAL DECISION MAKING: Stable/uncomplicated   EVALUATION COMPLEXITY: Low     GOALS: Goals reviewed with patient? Yes   SHORT TERM GOALS: Target date: 12/26/2021    Pt will report understanding and adherence to initial HEP in order to promote independence in the management of primary impairments. Baseline: HEP provided at eval Goal status: Ongoing Reports inconsistency 12/18/21       LONG TERM GOALS: Target date: 01/23/2022    Pt will achieve an LEFS score of 65/80 or greater in order to demonstrate improved functional ability as it relates to her knee impairments. Baseline: 45/80 Goal status: INITIAL   2.  Pt will  achieve knee AROM of 0-130 in order to accomplish WNL gait pattern. Baseline: -4 - 117 Goal status: INITIAL   3.  Pt will achieve global BIL LE strength of 4+/5 or greater in order to progress her independent LE strengthening program with less limitation. Baseline: See MMT chart Goal status: INITIAL   4.  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 Baseline: Unable to stand longer than 15 minutes without >5/10 pain Goal status: INITIAL       PLAN: PT FREQUENCY: 1-2x/week   PT DURATION: 8 weeks   PLANNED INTERVENTIONS: Therapeutic exercises, Therapeutic activity, Neuromuscular re-education, Balance training, Gait training, Patient/Family education, Joint mobilization, Stair training, Orthotic/Fit training, Aquatic Therapy, Dry Needling, Electrical stimulation, Cryotherapy, Moist heat, Taping, Vasopneumatic device, Biofeedback, Ionotophoresis 59m/ml Dexamethasone, Manual therapy, and Re-evaluation   PLAN FOR NEXT SESSION: Progress early knee ROM and strengthening as tolerated    SMargarette Canada PTA 01/01/2022, 3:30 PM   PHYSICAL THERAPY DISCHARGE SUMMARY  Visits from Start of Care: 4  Current functional level related to goals / functional outcomes: Unable to assess   Remaining deficits: Unable to assess   Education / Equipment: HEP   Patient agrees to discharge. Patient goals were not met. Patient is being discharged due to not returning since the last visit.  YVanessa Pickrell PT, DPT 02/20/22 5:42 PM

## 2022-01-08 ENCOUNTER — Ambulatory Visit: Payer: Medicaid Other

## 2022-01-17 ENCOUNTER — Ambulatory Visit: Payer: Medicaid Other

## 2022-06-24 ENCOUNTER — Ambulatory Visit: Payer: Medicaid Other | Admitting: Podiatry

## 2022-06-30 ENCOUNTER — Ambulatory Visit (INDEPENDENT_AMBULATORY_CARE_PROVIDER_SITE_OTHER): Payer: Medicaid Other | Admitting: Podiatry

## 2022-06-30 VITALS — BP 122/71 | HR 72 | Temp 97.3°F

## 2022-06-30 DIAGNOSIS — D2271 Melanocytic nevi of right lower limb, including hip: Secondary | ICD-10-CM | POA: Diagnosis not present

## 2022-07-03 ENCOUNTER — Encounter: Payer: Self-pay | Admitting: Podiatry

## 2022-07-03 NOTE — Progress Notes (Signed)
  Subjective:  Patient ID: Ana Paul, female    DOB: 06/29/1995,  MRN: [id number]  Chief Complaint  Patient presents with   Nail Problem    Right toenail dark spot undernearth lateral toenail. Patient noticed it last year and is concerned it could be something serious.     27 y.o. female presents with the above complaint. History confirmed with patient.  Has not really been here before  Objective:  Physical Exam: warm, good capillary refill, no trophic changes or ulcerative lesions, normal DP and PT pulses, normal sensory exam, and pigmented lesion distal lateral nail fold, does not involve nail plate, no ulceration or drainage, flat round and symmetric      Assessment:   1. Junctional nevus of foot, right      Plan:  Patient was evaluated and treated and all questions answered.  I discussed with her it is likely a benign junctional nevus.  I recommended monitoring, measurements and photographs were taken.  Approximately 1 mm in diameter.  I will see her back in 2 months to reevaluate.  If any concerns of increasing size at that point we will plan to biopsy  Return in about 8 weeks (around 08/25/2022) for re-check skin lesion near nail bed.

## 2022-08-25 ENCOUNTER — Ambulatory Visit: Payer: Medicaid Other | Admitting: Podiatry

## 2022-08-27 ENCOUNTER — Ambulatory Visit: Payer: Medicaid Other | Admitting: Podiatry

## 2023-07-13 ENCOUNTER — Ambulatory Visit (INDEPENDENT_AMBULATORY_CARE_PROVIDER_SITE_OTHER): Payer: Medicaid Other | Admitting: Podiatry

## 2023-07-13 DIAGNOSIS — D2271 Melanocytic nevi of right lower limb, including hip: Secondary | ICD-10-CM

## 2023-07-13 DIAGNOSIS — D492 Neoplasm of unspecified behavior of bone, soft tissue, and skin: Secondary | ICD-10-CM | POA: Diagnosis not present

## 2023-07-13 DIAGNOSIS — L608 Other nail disorders: Secondary | ICD-10-CM

## 2023-07-16 NOTE — Progress Notes (Signed)
  Subjective:  Patient ID: Ana Paul, female    DOB: 1996-05-15,  MRN: [id number]  Chief Complaint  Patient presents with   Nail Problem    Pt stated that she keeps getting these random black places in her nails that concern her     28 y.o. female presents with the above complaint. History confirmed with patient.  Also noted areas in a few toenails  Objective:  Physical Exam: warm, good capillary refill, no trophic changes or ulcerative lesions, normal DP and PT pulses, normal sensory exam, and pigmented lesion distal lateral nail fold, has not changed since last visit, few small spots of petechial hemorrhage and transverse Beau's lines in the great toenails      Assessment:   1. Junctional nevus of foot, right   2. Splinter hemorrhage of toenail      Plan:  Patient was evaluated and treated and all questions answered.  Junctional knee has unchanged appearance.  Continue to monitor.  She has some small petechial hemorrhage and transverse Beau's lines, we discussed likely there is microtrauma from shoe gear and discussed appropriate shoe and nail care to alleviate this.  Return as needed.  No follow-ups on file.

## 2023-11-11 ENCOUNTER — Ambulatory Visit: Admitting: Orthopaedic Surgery

## 2023-11-11 ENCOUNTER — Other Ambulatory Visit (INDEPENDENT_AMBULATORY_CARE_PROVIDER_SITE_OTHER): Payer: Self-pay

## 2023-11-11 DIAGNOSIS — M25561 Pain in right knee: Secondary | ICD-10-CM

## 2023-11-11 DIAGNOSIS — G8929 Other chronic pain: Secondary | ICD-10-CM

## 2023-11-11 MED ORDER — MELOXICAM 7.5 MG PO TABS: 7.5000 mg | ORAL_TABLET | Freq: Two times a day (BID) | ORAL | 2 refills | Status: DC | PRN

## 2023-11-11 NOTE — Progress Notes (Signed)
 Office Visit Note   Patient: Ana Paul           Date of Birth: Dec 14, 1995           MRN: [id number] Visit Date: 11/11/2023              Requested by: No referring provider defined for this encounter. PCP: Pcp, No   Assessment & Plan: Visit Diagnoses:  1. Chronic pain of right knee     Plan: History of Present Illness Ana Paul is a 28 year old female who presents with knee pain and difficulty extending the leg.  She experiences constant pressure in the knee and difficulty bearing weight, with occasional inability to fully extend the knee. These symptoms have persisted since lifting a heavy child. There is no swelling or sensation of the kneecap shifting out of place. A previous MRI from 2023 indicated a tear of the lateral meniscus. She reports discomfort when pressure is applied to the knee, particularly on the medial side. 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  Physical Exam MUSCULOSKELETAL: No tenderness along medial retinaculum. Patellar mobility symmetric, no dislocation. Trace effusion in knee. Pain along medial side of knee. Knee flexibility normal. Slight swelling of knee.  Results RADIOLOGY Knee MRI: reviewed  Assessment and Plan Knee pain with possible inflammation Chronic knee pain with suspected inflammation, exacerbated by lifting. Previous MRI suggesting lateral meniscus tear deemed inaccurate. Discussed weight management's role in patellofemoral issues. - Prescribe anti-inflammatory medication for two weeks. - Instruct to perform home exercises from previous physical therapy. - Advise to report if symptoms do not improve. - Consider MRI if symptoms persist to assess ligament healing from previous dislocation.  Follow-Up Instructions: No follow-ups on file.   Orders:  Orders Placed This Encounter  Procedures   XR Knee 1-2 Views Right   Meds ordered this encounter  Medications   meloxicam  (MOBIC) 7.5 MG tablet    Sig: Take 1 tablet (7.5 mg total) by mouth 2 (two) times daily as needed for pain.    Dispense:  30 tablet    Refill:  2    Subjective: Chief Complaint  Patient presents with   Right Knee - Pain    Review of Systems  Constitutional: Negative.   HENT: Negative.    Eyes: Negative.   Respiratory: Negative.    Cardiovascular: Negative.   Endocrine: Negative.   Musculoskeletal: Negative.   Neurological: Negative.   Hematological: Negative.   Psychiatric/Behavioral: Negative.    All other systems reviewed and are negative.    Objective: Vital Signs: There were no vitals taken for this visit.  Physical Exam Vitals and nursing note reviewed.  Constitutional:      Appearance: She is well-developed.  HENT:     Head: Atraumatic.     Nose: Nose normal.   Eyes:     Extraocular Movements: Extraocular movements intact.    Cardiovascular:     Pulses: Normal pulses.  Pulmonary:     Effort: Pulmonary effort is normal.  Abdominal:     Palpations: Abdomen is soft.   Musculoskeletal:     Cervical back: Neck supple.   Skin:    General: Skin is warm.     Capillary Refill: Capillary refill takes less than 2 seconds.   Neurological:     Mental Status: She is alert. Mental status is at baseline.   Psychiatric:        Behavior: Behavior normal.  Thought Content: Thought content normal.        Judgment: Judgment normal.     Imaging: XR Knee 1-2 Views Right Result Date: 11/11/2023 X-rays of the right knee show no acute or structural abnormalities.    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  Family History  Problem Relation Age of Onset   Healthy Mother    Healthy Father     Past Surgical History:  Procedure Laterality Date   CESAREAN SECTION     CESAREAN SECTION N/A    [employer] 06/29/2020   Social History   Occupational History   Not on file  Tobacco Use   Smoking status: Never   Smokeless tobacco: Never  Vaping Use   Vaping status: Never Used  Substance and Sexual Activity   Alcohol use: Never   Drug use: Never   Sexual activity: Not on file

## 2024-03-10 IMAGING — MR MR KNEE*R* W/O CM
6 of 7 series · 38 of 40 positions shown · non-contrast
Comparison: Radiographs dated September 22, 2021

CLINICAL DATA: Right knee pain, dancing injury 20 days ago.
Patellar dislocation since injury.

EXAM:
MRI OF THE RIGHT KNEE WITHOUT CONTRAST
TECHNIQUE: Multiplanar, multisequence MR imaging of the right was performed. No
intravenous contrast was administered.

[Series 6: T2 fat-sat · axial · right · 4.0mm · 0.50mm/px · z∈[-60,+93]mm · 8 of 36 slices shown (1 of 3)]
[im 1/36]
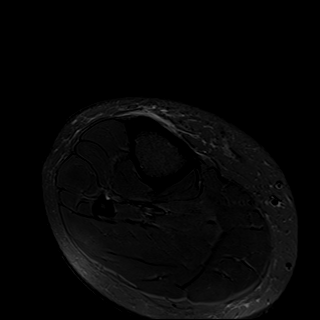
[im 6/36]
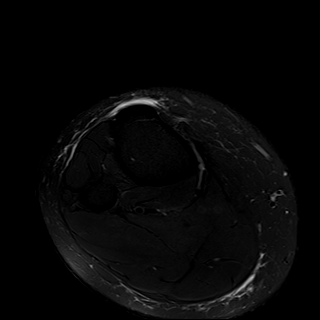
[im 11/36]
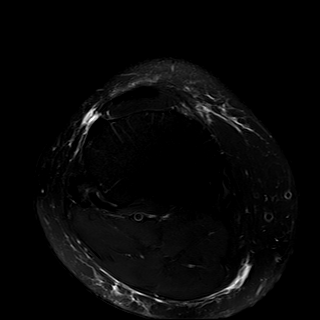
[im 16/36]
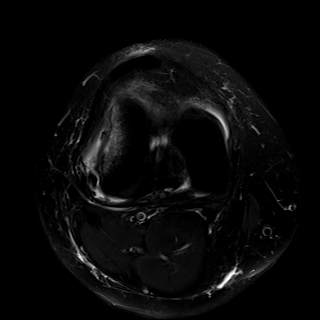
[im 21/36]
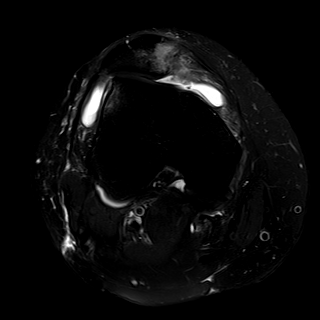
[im 26/36]
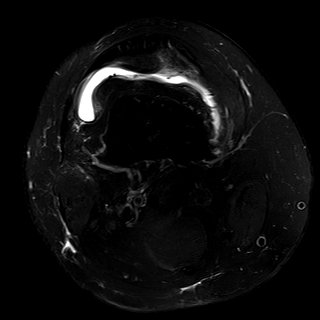
[im 31/36]
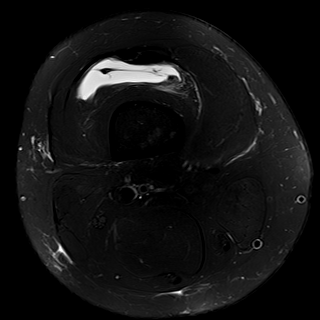
[im 36/36]
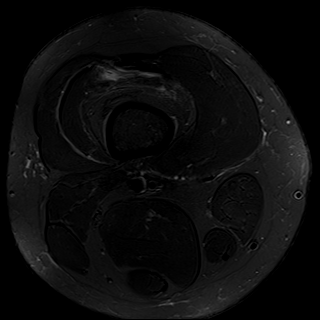

[Series 7: T2 fat-sat · coronal · right · 4.0mm · 0.47mm/px · 6 of 28 slices shown (2 of 3)]
[im 1/28]
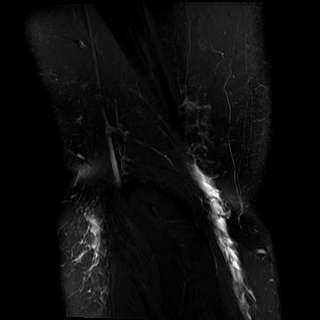
[im 6/28]
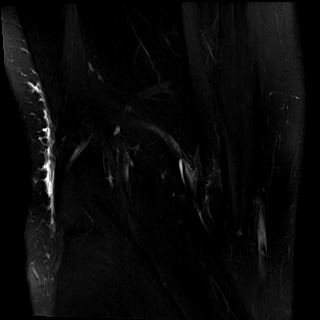
[im 11/28]
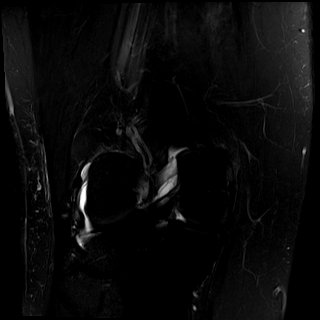
[im 17/28]
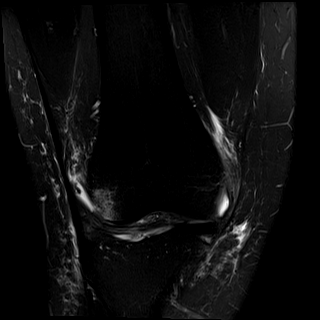
[im 22/28]
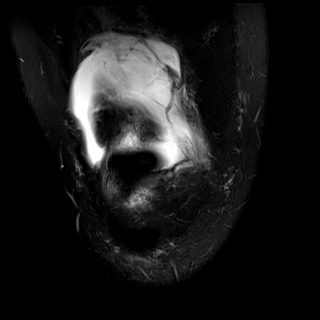
[im 28/28]
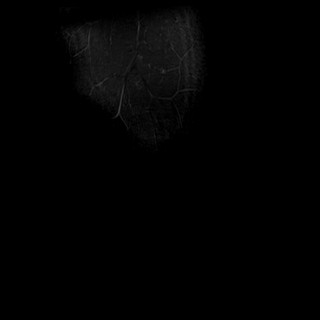

[Series 8: T1 · coronal · right · 4.0mm · 0.47mm/px · 6 of 28 slices shown]
[im 1/28]
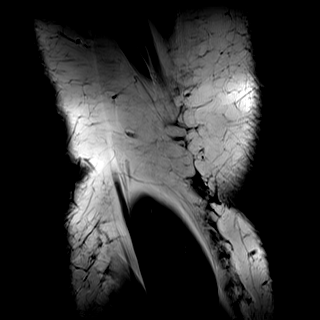
[im 6/28]
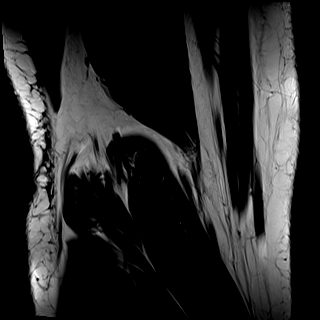
[im 11/28]
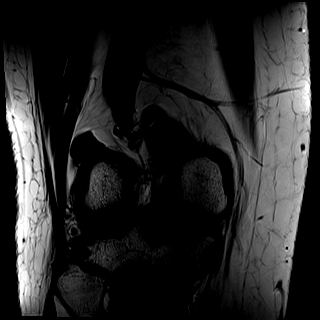
[im 17/28]
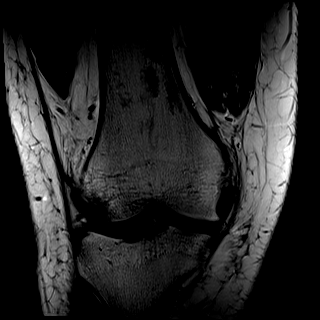
[im 22/28]
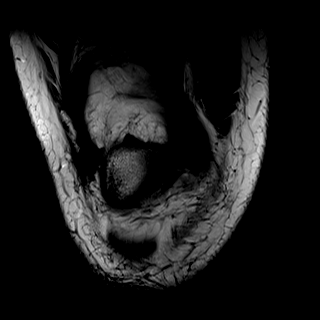
[im 28/28]
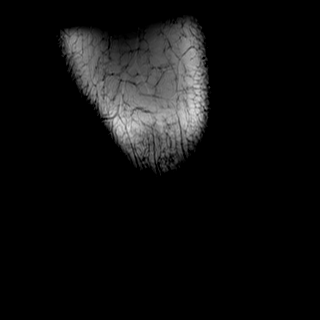

[Series 9: PD fat-sat · coronal · right · 3.0mm · 0.47mm/px · 6 of 28 slices shown (1 of 2)]
[im 1/28]
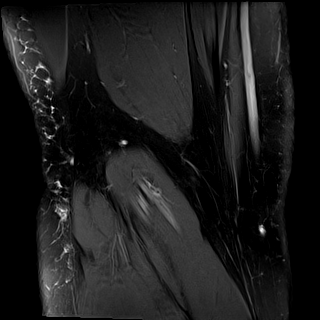
[im 6/28]
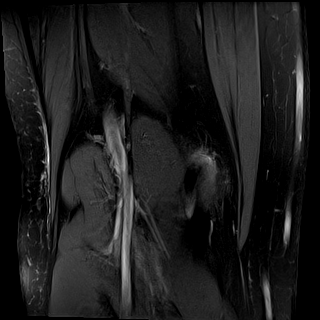
[im 11/28]
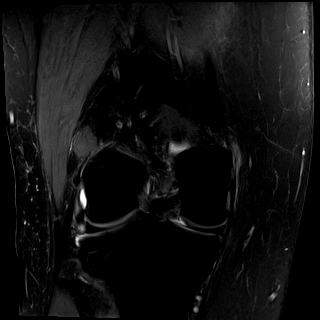
[im 17/28]
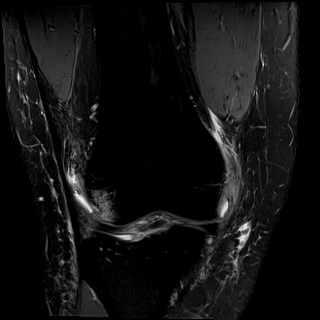
[im 22/28]
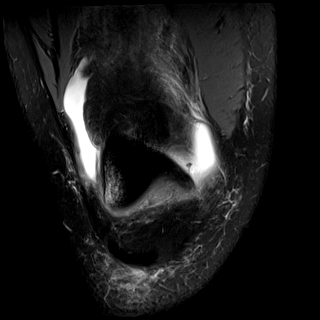
[im 28/28]
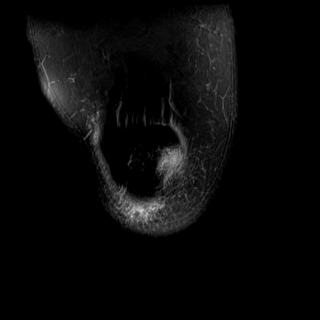

[Series 10: PD fat-sat · sagittal · right · 3.0mm · 0.39mm/px · 6 of 27 slices shown (2 of 2)]
[im 1/27]
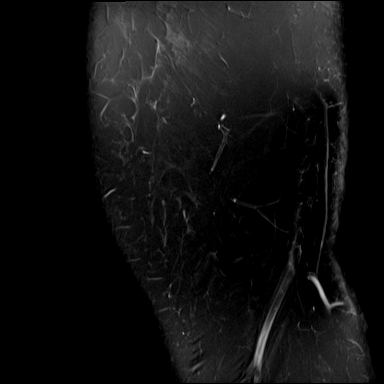
[im 6/27]
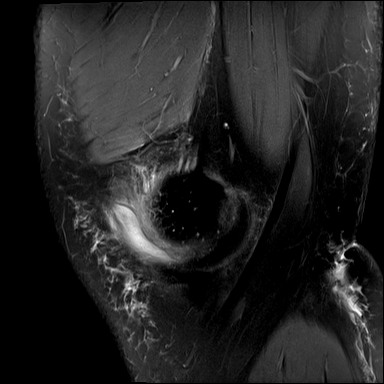
[im 11/27]
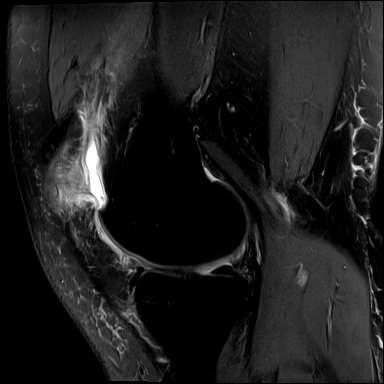
[im 16/27]
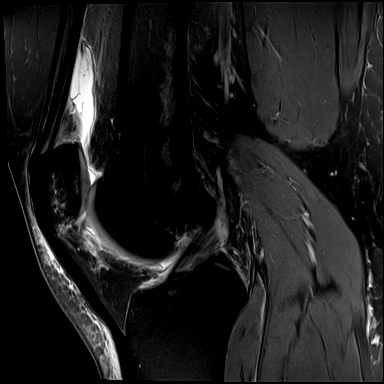
[im 21/27]
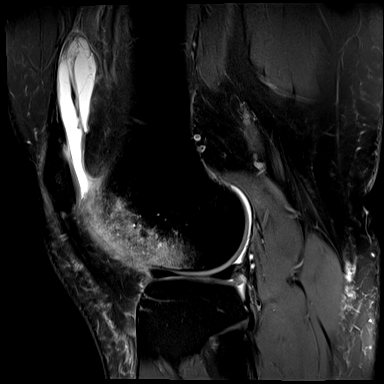
[im 27/27]
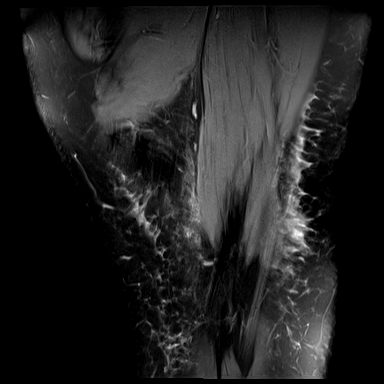

[Series 11: T2 fat-sat · sagittal · right · 3.0mm · 0.39mm/px · 6 of 27 slices shown (3 of 3)]
[im 1/27]
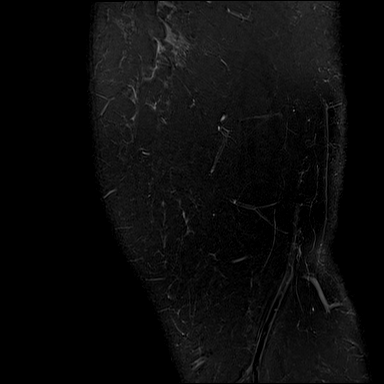
[im 6/27]
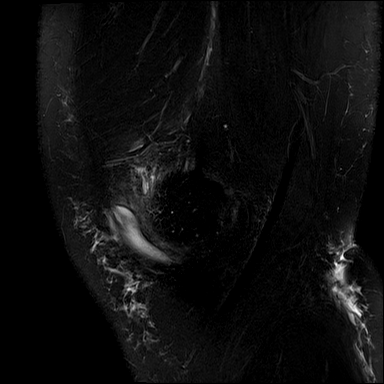
[im 11/27]
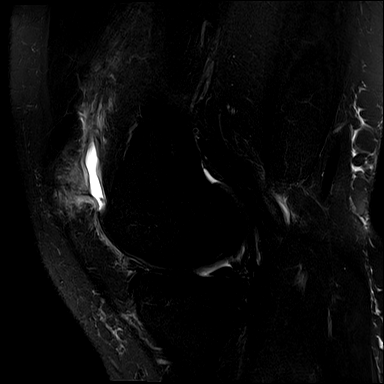
[im 16/27]
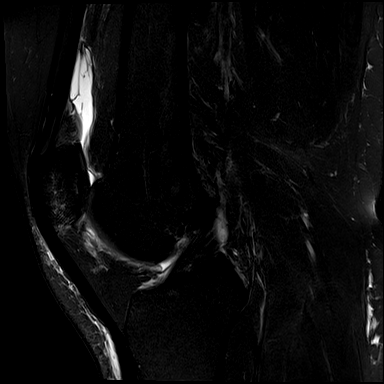
[im 21/27]
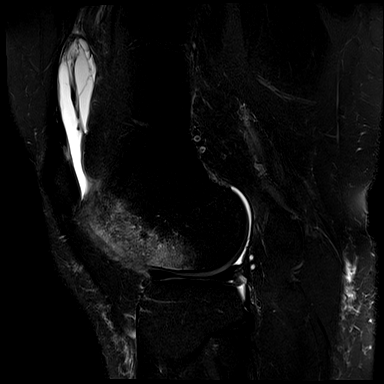
[im 27/27]
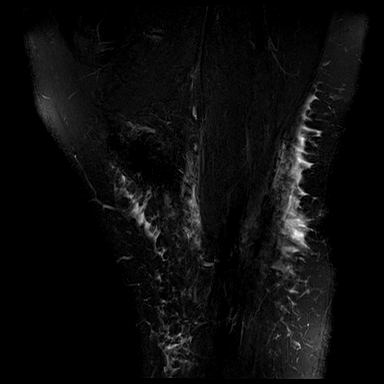

[38 of 40 positions shown; findings below may reference images not displayed]

FINDINGS: MENISCI

Medial: Intact.

Lateral: Horizontal tear of the anterior horn of the lateral
meniscus with small parameniscal cyst measuring up to 5 mm.

LIGAMENTS

Cruciates: ACL and PCL are intact.

Collaterals: Medial collateral ligament is intact. Lateral
collateral ligament complex is intact.

CARTILAGE

Patellofemoral:  No chondral defect.

Medial:  No chondral defect.

Lateral:  No chondral defect.

JOINT: Large joint effusion. Mild edema of the Hoffa's fat pad. No
plical thickening.

POPLITEAL FOSSA: Popliteus tendon is intact. No Baker's cyst.

EXTENSOR MECHANISM: Intact quadriceps tendon. Intact patellar
tendon. Intact lateral patellar retinaculum. Complete tear of the
medial patellofemoral ligament.

BONES: Bone marrow edema of the medial aspect of the patella and
lateral aspect of the lateral femoral condyle consistent with bone
contusions in the settings of patellar dislocation/relocation.

Other: No fluid collection or hematoma. Muscles are normal.
IMPRESSION: 1. Bone contusions of the medial patella and lateral aspect of the
lateral femoral condyle with disruption of the medial patellofemoral
ligament consistent with transient patellar dislocation.

2.  Large suprapatellar joint effusion.

3. Horizontal tear of the anterior horn of the lateral meniscus with
small parameniscal cyst measuring up to 5 mm, likely sequela of
recent trauma.

4. Cruciate and collateral ligaments are intact. Quadriceps and
patellar tendon are also maintained.

## 2024-03-21 ENCOUNTER — Encounter: Payer: Self-pay | Admitting: Radiology

## 2024-06-01 ENCOUNTER — Ambulatory Visit: Admitting: Certified Nurse Midwife

## 2024-06-01 ENCOUNTER — Encounter: Payer: Self-pay | Admitting: Certified Nurse Midwife

## 2024-06-01 ENCOUNTER — Other Ambulatory Visit: Payer: Self-pay

## 2024-06-01 VITALS — BP 122/74 | HR 80 | Wt 221.2 lb

## 2024-06-01 DIAGNOSIS — Z3201 Encounter for pregnancy test, result positive: Secondary | ICD-10-CM

## 2024-06-01 DIAGNOSIS — O3680X0 Pregnancy with inconclusive fetal viability, not applicable or unspecified: Secondary | ICD-10-CM | POA: Diagnosis not present

## 2024-06-01 DIAGNOSIS — Z3200 Encounter for pregnancy test, result unknown: Secondary | ICD-10-CM

## 2024-06-01 LAB — POCT PREGNANCY, URINE: Preg Test, Ur: POSITIVE — AB

## 2024-06-01 NOTE — Progress Notes (Signed)
 Possible Pregnancy  Here today for pregnancy confirmation. UPT in office today is positive. Pt reports first positive home UPT on 05/20/24. Regular, monthly menstrual period prior to pregnancy. Reviewed dating with patient:   LMP: 04/14/25 EDD: 01/19/25 6w 6d today  OB history reviewed; twin pregnancy delivered 03/21/19 via c-section. History of laparoscopic surgery during pregnancy due to twin to twin transfusion. Reviewed medications and allergies with patient; list of medications safe to take during pregnancy given. Reviewed availability of MAU for any emergencies including vaginal bleeding and abdominal pain. Viability US  scheduled for 06/08/24. New OB visit scheduled for 06/29/24. Cornell Finder, CNM to bedside for brief visit to establish care with patient.  Vernell FORBES Ruddle, RN 06/01/2024  3:29 PM

## 2024-06-01 NOTE — Patient Instructions (Signed)
 Maternity Assessment Unit  [facility]  [address]  [place], [place]     Safe Medications in Pregnancy   Acne:  Benzoyl Peroxide  Salicylic Acid   Backache/Headache:  Tylenol: 2 regular strength every 4 hours OR               2 Extra strength every 6 hours   Colds/Coughs/Allergies:  Benadryl  (alcohol free) 25 mg every 6 hours as needed  Breath right strips  Claritin  Cepacol throat lozenges  Chloraseptic throat spray  Cold-Eeze- up to three times per day  Cough drops, alcohol free  Flonase (by prescription only)  Guaifenesin  Mucinex  Robitussin DM (plain only, alcohol free)  Saline nasal spray/drops  Sudafed (pseudoephedrine ) & Actifed * use only after [redacted] weeks gestation and if you do not have high blood pressure  Tylenol  Vicks Vaporub  Zinc lozenges  Zyrtec    Constipation:  Colace  Ducolax suppositories  Fleet enema  Glycerin suppositories  Metamucil  Milk of magnesia  Miralax  Senokot  Smooth move tea   Diarrhea:  Kaopectate  Imodium A-D   *NO pepto Bismol   Hemorrhoids:  Anusol  Anusol HC  Preparation H  Tucks   Indigestion:  Tums  Maalox  Mylanta  Zantac  Pepcid   Insomnia:  Benadryl  (alcohol free) 25mg  every 6 hours as needed  Tylenol PM  Unisom, no Gelcaps   Leg Cramps:  Tums  MagGel   Nausea/Vomiting:  Bonine  Dramamine  Emetrol  Ginger extract  Sea bands  Meclizine  Nausea medication to take during pregnancy:  Unisom (doxylamine succinate 25 mg tablets) Take one tablet daily at bedtime. If symptoms are not adequately controlled, the dose can be increased to a maximum recommended dose of two tablets daily (1/2 tablet in the morning, 1/2 tablet mid-afternoon and one at bedtime).  Vitamin B6 100mg  tablets. Take one tablet twice a day (up to 200 mg per day).   Skin Rashes:  Aveeno products  Benadryl  cream or 25mg  every 6 hours as needed  Calamine Lotion  1%  cortisone cream   Yeast infection:  Gyne-lotrimin 7  Monistat 7    **If taking multiple medications, please check labels to avoid duplicating the same active ingredients  **take medication as directed on the label  ** Do not exceed 4000 mg of tylenol in 24 hours  **Do not take medications that contain aspirin or ibuprofen 

## 2024-06-05 ENCOUNTER — Other Ambulatory Visit: Payer: Self-pay

## 2024-06-05 ENCOUNTER — Inpatient Hospital Stay (HOSPITAL_COMMUNITY)
Admission: AD | Admit: 2024-06-05 | Discharge: 2024-06-05 | Disposition: A | Discharge status: Home / Self Care | Attending: Obstetrics & Gynecology | Admitting: Obstetrics & Gynecology

## 2024-06-05 DIAGNOSIS — Z3A08 8 weeks gestation of pregnancy: Secondary | ICD-10-CM | POA: Insufficient documentation

## 2024-06-05 DIAGNOSIS — Z3A01 Less than 8 weeks gestation of pregnancy: Secondary | ICD-10-CM

## 2024-06-05 DIAGNOSIS — O219 Vomiting of pregnancy, unspecified: Secondary | ICD-10-CM | POA: Diagnosis present

## 2024-06-05 LAB — COMPREHENSIVE METABOLIC PANEL WITH GFR
ALT: 12 U/L (ref 0–44)
AST: 22 U/L (ref 15–41)
Albumin: 4.4 g/dL (ref 3.5–5.0)
Alkaline Phosphatase: 60 U/L (ref 38–126)
Anion gap: 11 (ref 5–15)
BUN: 5 mg/dL — ABNORMAL LOW (ref 6–20)
CO2: 24 mmol/L (ref 22–32)
Calcium: 9.3 mg/dL (ref 8.9–10.3)
Chloride: 102 mmol/L (ref 98–111)
Creatinine, Ser: 0.74 mg/dL (ref 0.44–1.00)
GFR, Estimated: >60 mL/min
Glucose, Bld: 91 mg/dL (ref 70–99)
Potassium: 3.8 mmol/L (ref 3.5–5.1)
Sodium: 137 mmol/L (ref 135–145)
Total Bilirubin: 0.9 mg/dL (ref 0.0–1.2)
Total Protein: 7.7 g/dL (ref 6.5–8.1)

## 2024-06-05 LAB — URINALYSIS, ROUTINE W REFLEX MICROSCOPIC
Bilirubin Urine: NEGATIVE
Glucose, UA: NEGATIVE mg/dL
Hgb urine dipstick: NEGATIVE
Ketones, ur: 20 mg/dL — AB
Nitrite: NEGATIVE
Protein, ur: 30 mg/dL — AB
Specific Gravity, Urine: 1.027 (ref 1.005–1.030)
pH: 7 (ref 5.0–8.0)

## 2024-06-05 LAB — CBC
HCT: 41.8 % (ref 36.0–46.0)
Hemoglobin: 13.2 g/dL (ref 12.0–15.0)
MCH: 23.3 pg — ABNORMAL LOW (ref 26.0–34.0)
MCHC: 31.6 g/dL (ref 30.0–36.0)
MCV: 73.7 fL — ABNORMAL LOW (ref 80.0–100.0)
Platelets: 290 K/uL (ref 150–400)
RBC: 5.67 MIL/uL — ABNORMAL HIGH (ref 3.87–5.11)
RDW: 14.2 % (ref 11.5–15.5)
WBC: 6.7 K/uL (ref 4.0–10.5)
nRBC: 0 % (ref 0.0–0.2)

## 2024-06-05 MED ORDER — ONDANSETRON HCL 4 MG PO TABS: 4.0000 mg | ORAL_TABLET | Freq: Three times a day (TID) | ORAL | 0 refills | Status: DC | PRN

## 2024-06-05 MED ORDER — METOCLOPRAMIDE HCL 5 MG/ML IJ SOLN: 10.0000 mg | Freq: Once | INTRAMUSCULAR | Status: DC

## 2024-06-05 MED ORDER — SCOPOLAMINE 1 MG/3DAYS TD PT72
1.0000 | MEDICATED_PATCH | TRANSDERMAL | Status: DC
  Administered 2024-06-05: 1 mg via TRANSDERMAL
  Filled 2024-06-05: qty 1

## 2024-06-05 MED ORDER — SCOPOLAMINE 1 MG/3DAYS TD PT72: 1.0000 | MEDICATED_PATCH | TRANSDERMAL | 0 refills | Status: AC

## 2024-06-05 MED ORDER — ONDANSETRON 4 MG PO TBDP: 8.0000 mg | ORAL_TABLET | Freq: Once | ORAL | Status: DC

## 2024-06-05 NOTE — MAU Provider Note (Signed)
 " History     CSN: [id number]  Arrival date and time: 06/05/24 1422 First Provider Initiated Contact with Patient   Chief Complaint  Patient presents with   Emesis    HPI Ana Paul is a 29 y.o. G2P0102 at [redacted]w[redacted]d, 01/19/2025, by Last Menstrual Period, who presents to the Maternity Assessment Unit for vomiting.  Vomiting since yesterday morning. 6-7x since then. Has had morning sickness type nausea that started this week, but vomiting is new. Reports she vomits anytime she has a drink, has not had any sips thus no vomiting since arrival. She reports vomitus is usually stomach contents, not bile or dry heaving. She denies any sick contacts at home/work/school.   ROS (+) n/v,  (-) VB, diarrhea, f/c   No past medical history on file. Past Surgical History:  Procedure Laterality Date   CESAREAN SECTION N/A    [employer]reesia 06/29/2020   Allergies[1]  Physical Exam  BP 123/79 (BP Location: Right Arm)   Pulse 76   Temp 98.2 F (36.8 C) (Axillary)   Resp 18   Ht 5' 4 (1.626 m)   Wt 99.4 kg   LMP 04/14/2024 (Exact Date)   SpO2 100%   BMI 37.63 kg/m   Gen: alert, no acute distress CV: regular rate Resp: nonlabored Abd: nontender  Labs     Results for orders placed or performed during the hospital encounter of 06/05/24 (from the past 24 hours)  CBC     Status: Abnormal   Collection Time: 06/05/24  2:36 PM  Result Value Ref Range   WBC 6.7 4.0 - 10.5 K/uL   RBC 5.67 (H) 3.87 - 5.11 MIL/uL   Hemoglobin 13.2 12.0 - 15.0 g/dL   HCT 58.1 63.9 - 53.9 %   MCV 73.7 (L) 80.0 - 100.0 fL   MCH 23.3 (L) 26.0 - 34.0 pg   MCHC 31.6 30.0 - 36.0 g/dL   RDW 85.7 88.4 - 84.4 %   Platelets 290 150 - 400 K/uL   nRBC 0.0 0.0 - 0.2 %  Comprehensive metabolic panel     Status: Abnormal   Collection Time: 06/05/24  2:36 PM  Result Value Ref Range   Sodium 137 135 - 145 mmol/L   Potassium 3.8 3.5 - 5.1 mmol/L   Chloride 102 98 - 111 mmol/L   CO2 24 22 - 32 mmol/L   Glucose, Bld 91 70 - 99  mg/dL   BUN 5 (L) 6 - 20 mg/dL   Creatinine, Ser 9.25 0.44 - 1.00 mg/dL   Calcium 9.3 8.9 - 89.6 mg/dL   Total Protein 7.7 6.5 - 8.1 g/dL   Albumin 4.4 3.5 - 5.0 g/dL   AST 22 15 - 41 U/L   ALT 12 0 - 44 U/L   Alkaline Phosphatase 60 38 - 126 U/L   Total Bilirubin 0.9 0.0 - 1.2 mg/dL   GFR, Estimated >39 >39 mL/min   Anion gap 11 5 - 15  Urinalysis, Routine w reflex microscopic -Urine, Clean Catch     Status: Abnormal   Collection Time: 06/05/24  3:39 PM  Result Value Ref Range   Color, Urine YELLOW YELLOW   APPearance CLOUDY (A) CLEAR   Specific Gravity, Urine 1.027 1.005 - 1.030   pH 7.0 5.0 - 8.0   Glucose, UA NEGATIVE NEGATIVE mg/dL   Hgb urine dipstick NEGATIVE NEGATIVE   Bilirubin Urine NEGATIVE NEGATIVE   Ketones, ur 20 (A) NEGATIVE mg/dL   Protein, ur 30 (A) NEGATIVE mg/dL  Nitrite NEGATIVE NEGATIVE   Leukocytes,Ua TRACE (A) NEGATIVE   RBC / HPF 0-5 0 - 5 RBC/hpf   WBC, UA 0-5 0 - 5 WBC/hpf   Bacteria, UA RARE (A) NONE SEEN   Squamous Epithelial / HPF 6-10 0 - 5 /HPF   Mucus PRESENT    Hyaline Casts, UA PRESENT     Assessment and Plan  MDM Ana Paul is a 29 y.o. G2P0102 at [redacted]w[redacted]d, 01/19/2025, by Last Menstrual Period, who presents to the MAU for vomiting. Ddx: nausea/vomiting includes but is not limited to: nausea due to pregnancy, hyperemesis gravidarum, viral infection, gastroenteritis, cholecystitis, pancreatitis, ACS, food poisoning, diabetic ketoacidosis, drug ingestion, gastroparesis, alcohol/opiate withdrawal.  Orders Placed This Encounter  Procedures   CBC   Comprehensive metabolic panel   Urinalysis, Routine w reflex microscopic -Urine, Clean Catch   Discharge patient   Meds ordered this encounter  Medications   DISCONTD: ondansetron  (ZOFRAN -ODT) disintegrating tablet 8 mg   scopolamine  (TRANSDERM-SCOP) 1 MG/3DAYS 1 mg   DISCONTD: metoCLOPramide  (REGLAN ) injection 10 mg   scopolamine  (TRANSDERM-SCOP) 1 MG/3DAYS    Sig: Place 1 patch (1 mg total)  onto the skin every 3 (three) days.    Dispense:  10 patch    Refill:  0   ondansetron  (ZOFRAN ) 4 MG tablet    Sig: Take 1 tablet (4 mg total) by mouth every 8 (eight) hours as needed for nausea or vomiting.    Dispense:  20 tablet    Refill:  0    Pt feeling improved after scopolamine  patch in waiting area. PO challenged after my interview and evaluation. She toelrated water and graham crackers without worsened nausea or any episode of emesis. Discussed lifestyle management of morning sickness and return precautions. Rx scopolamine  and Zofran . DC home.   1. Nausea and vomiting in pregnancy (Primary) - Discharge patient  2. Less than [redacted] weeks gestation of pregnancy  Results pending at the time of DC: none Dispo: DC home in stable condition with return precautions discussed and included in AVS.    Barabara Maier, DO [facility]     [1]  Allergies Allergen Reactions   Griseofulvin Hives   "

## 2024-06-05 NOTE — MAU Note (Signed)
 Ana Paul is a 29 y.o. at [redacted]w[redacted]d here in MAU reporting: vomiting since yesterday 7-8x and she can't keep solids or liquids down. Denies any diarrhea. States her vision is blurry and had a headache when she first got here. Thinks she is dehydrated. Denies any abdominal pain or VB.   Next OB appointment in February.   Onset of complaint: yesterday Pain score: 0 Vitals:   06/05/24 1531  BP: 123/79  Pulse: 76  Resp: 18  Temp: 98.2 F (36.8 C)  SpO2: 100%     FHT:n/a Lab orders placed from triage:  UA

## 2024-06-05 NOTE — Discharge Instructions (Signed)
 Safe Medications in Pregnancy  - Take medications as directed on the package. Medications are listed by Brand name, store brands are considered equivalent to brand name, just be sure that the medications are the same. Ex: Tylenol (acetaminophen) - If taking multiple medications, please check labels to avoid duplicating the same active ingredients - Do not exceed 4000 mg of Tylenol (acetaminophen) in 24 hours - Do not take medications that contain aspirin or ibuprofen  (Motrin , Advil ) or naproxen (Aleve, Naprosyn)  Acne Benzoyl Peroxide Salicylic Acid  Allergies Benadryl  (diphenhydramine ) Claritin (loratadine) Flonase nasal spray (fluticasone) Saline nasal spray/drops  Backache/Headache Acetaminophen (Tylenol): 2 regular strength (325mg ) every 4 hours OR 2 extra strength (500mg ) every 6 hours  Colds/Coughs Breathe right strips Cepacol throat lozenges OR Chloraseptic throat spray cough drops, alcohol free Delsym (dextromethorphan, cough suppressant) Mucinex (guaifenesin, mucolytic/expectorant) Robitussin DM (dextromethorphan + guaifenesin) Saline nasal spray/drops Sudafed (pseudoephedrine , decongestant)  only after [redacted] weeks gestation and if you do not have high blood pressure Vicks Vaporub Zinc lozenges Zyrtec  (cetirizine )  Constipation Immediate relief Ducolax suppositories (bisacodyl) Fleet enema (saline enema) glycerin suppositories milk of magnesia Senokot (overnight) Smooth move tea (overnight)  to keep you regular Colace, Dulcolax (docusate) Metamucil (psyllium fiber) Miralax (polyethylene glycol)   Diarrhea Kaopectate (bismuth subsalicylate) Imodium A-D (loperamide) *NO pepto Bismol Hemorrhoids Anusol  Anusol HC (Rx only) Preparation H Tucks  Indigestion Tums Maalox Mylanta Pepcid  Insomnia Benadryl  (alcohol free) 25mg  every 6 hours as needed Tylenol PM Unisom, no Gelcaps  Leg  Cramps Tums MagGel  Nausea/Vomiting Bonine Dramamine Emetrol Ginger extract Sea bands Meclizine (Rx only)  Nausea medication to take during pregnancy Unisom (doxylamine succinate 25 mg tablets) Take one half tablet daily at bedtime. Vitamin B6 100mg  tablets. Take one tablet twice a day (up to 200 mg per day).  Skin Rashes: Aveeno products Benadryl  cream or 25mg  pill every 6 hours as needed Calamine Lotion 1% cortisone cream  Yeast infection: Gyne-lotrimin 7 Monistat 7    FOR NAUSEA AND VOMITING: - take half a tablet of Unisom (12.5mg  dose) and 1 tablet of Vitamin B6 at night. This would be in place of Diclegis if Diclegis is not covered by your insurance.  Be aware that the Unisom may make you drowsy!   Morning Sickness Morning sickness is when you throw up or feel like you may throw up during pregnancy. This condition often occurs in the morning, but it can also occur at any time of day. Morning sickness is most common during the first three months of pregnancy, but it can go on throughout the pregnancy. Morning sickness is usually harmless. But if you throw up all the time, you should see your health care provider.  The cause of morning sickness is not known. It may be linked to changes in hormones during pregnancy.  You're more likely to have morning sickness if: You had morning sickness in another pregnancy. You're pregnant with more than one baby, such as twins. You had morning sickness in other pregnancies. You have had motion sickness before you were pregnant. You have had bad headaches or migraines before you were pregnant.  Symptoms of morning sickness include: Feeling like you may throw up. Throwing up.  Treatment is usually not needed for morning sickness. You may only need to change what you eat. In some cases, your provider may give you: Vitamin B6 supplements. Ginger-- try a lozenge or tea Medicines to prevent throwing up. Continue your prenatal  vitamin, you may need to take it at  night instead  Eating and drinking   Eat dry toast or crackers Eat 5 or 6 small meals a day. Try ginger ale made with real ginger, ginger tea, or ginger lozenges/hard candies. Drink fluids throughout the day. Eat protein foods when you need a snack. Nuts, yogurt, and cheese are good choices. Eat dry and bland foods like rice or baked potatoes. Foods that are high in carbohydrates are often helpful.  Foods to avoid Greasy foods. Fatty foods. Spicy foods.  Contact a health care provider if: Your symptoms do not get better. You feel dizzy or light-headed. You're losing weight. Get help right away if: The feeling that you may throw up will not go away, or you can't stop throwing up. You faint. You have very bad pain in your belly.  This information is not intended to replace advice given to you by your health care provider. Make sure you discuss any questions you have with your health care provider. Document Revised: 02/05/2023 Document Reviewed: 08/14/2022 -- adapted Elsevier Patient Education  2024 [employer].

## 2024-06-08 ENCOUNTER — Other Ambulatory Visit: Payer: Self-pay | Admitting: Certified Nurse Midwife

## 2024-06-08 ENCOUNTER — Other Ambulatory Visit: Payer: Self-pay

## 2024-06-08 ENCOUNTER — Other Ambulatory Visit

## 2024-06-08 DIAGNOSIS — O3680X0 Pregnancy with inconclusive fetal viability, not applicable or unspecified: Secondary | ICD-10-CM

## 2024-06-13 ENCOUNTER — Ambulatory Visit: Payer: Self-pay

## 2024-06-15 ENCOUNTER — Encounter: Payer: Self-pay | Admitting: Certified Nurse Midwife

## 2024-06-15 DIAGNOSIS — O210 Mild hyperemesis gravidarum: Secondary | ICD-10-CM

## 2024-06-15 MED ORDER — ONDANSETRON HCL 4 MG PO TABS: 4.0000 mg | ORAL_TABLET | Freq: Three times a day (TID) | ORAL | 3 refills | Status: AC | PRN

## 2024-06-15 MED ORDER — DOXYLAMINE-PYRIDOXINE 10-10 MG PO TBEC: DELAYED_RELEASE_TABLET | ORAL | 0 refills | Status: AC

## 2024-06-15 MED ORDER — PROMETHAZINE HCL 25 MG PO TABS: 25.0000 mg | ORAL_TABLET | Freq: Four times a day (QID) | ORAL | 1 refills | Status: AC | PRN

## 2024-06-21 ENCOUNTER — Encounter: Payer: Self-pay | Admitting: Certified Nurse Midwife

## 2024-06-29 ENCOUNTER — Encounter: Payer: Self-pay | Admitting: Certified Nurse Midwife
